# Patient Record
Sex: Female | Born: 1970 | Race: White | Hispanic: Yes | Marital: Single | Smoking: Never smoker
Health system: Southern US, Community
[De-identification: ages and names within clinical notes are randomized; demographics above are authoritative.]

## PROBLEM LIST (undated history)

## (undated) DIAGNOSIS — E2839 Other primary ovarian failure: Secondary | ICD-10-CM

## (undated) DIAGNOSIS — E288 Other ovarian dysfunction: Secondary | ICD-10-CM

## (undated) DIAGNOSIS — F419 Anxiety disorder, unspecified: Secondary | ICD-10-CM

## (undated) DIAGNOSIS — L811 Chloasma: Secondary | ICD-10-CM

## (undated) DIAGNOSIS — N83209 Unspecified ovarian cyst, unspecified side: Secondary | ICD-10-CM

## (undated) HISTORY — DX: Chloasma: L81.1

## (undated) HISTORY — DX: Unspecified ovarian cyst, unspecified side: N83.209

## (undated) HISTORY — DX: Anxiety disorder, unspecified: F41.9

## (undated) HISTORY — DX: Other ovarian dysfunction: E28.8

## (undated) HISTORY — PX: TUBAL LIGATION: SHX77

## (undated) HISTORY — DX: Other primary ovarian failure: E28.39

---

## 2003-04-11 ENCOUNTER — Encounter: Payer: Self-pay | Admitting: Obstetrics and Gynecology

## 2003-04-11 ENCOUNTER — Inpatient Hospital Stay (HOSPITAL_COMMUNITY): Admission: AD | Admit: 2003-04-11 | Discharge: 2003-04-11 | Payer: Self-pay | Admitting: Obstetrics and Gynecology

## 2003-09-25 ENCOUNTER — Inpatient Hospital Stay (HOSPITAL_COMMUNITY): Admission: AD | Admit: 2003-09-25 | Discharge: 2003-09-29 | Payer: Self-pay | Admitting: Family Medicine

## 2003-09-26 ENCOUNTER — Encounter (INDEPENDENT_AMBULATORY_CARE_PROVIDER_SITE_OTHER): Payer: Self-pay | Admitting: *Deleted

## 2003-10-12 ENCOUNTER — Inpatient Hospital Stay (HOSPITAL_COMMUNITY): Admission: AD | Admit: 2003-10-12 | Discharge: 2003-10-12 | Payer: Self-pay | Admitting: Obstetrics and Gynecology

## 2003-12-29 ENCOUNTER — Encounter: Admission: RE | Admit: 2003-12-29 | Discharge: 2003-12-29 | Payer: Self-pay | Admitting: Obstetrics and Gynecology

## 2004-01-24 ENCOUNTER — Encounter: Admission: RE | Admit: 2004-01-24 | Discharge: 2004-01-24 | Payer: Self-pay | Admitting: Obstetrics and Gynecology

## 2004-06-23 ENCOUNTER — Inpatient Hospital Stay (HOSPITAL_COMMUNITY): Admission: AD | Admit: 2004-06-23 | Discharge: 2004-06-23 | Payer: Self-pay | Admitting: *Deleted

## 2005-09-20 ENCOUNTER — Inpatient Hospital Stay (HOSPITAL_COMMUNITY): Admission: AD | Admit: 2005-09-20 | Discharge: 2005-09-20 | Payer: Self-pay | Admitting: Family Medicine

## 2005-10-23 ENCOUNTER — Inpatient Hospital Stay (HOSPITAL_COMMUNITY): Admission: AD | Admit: 2005-10-23 | Discharge: 2005-10-23 | Payer: Self-pay | Admitting: Obstetrics and Gynecology

## 2010-02-28 ENCOUNTER — Ambulatory Visit: Payer: Self-pay | Admitting: Gynecology

## 2010-03-06 ENCOUNTER — Ambulatory Visit: Payer: Self-pay | Admitting: Gynecology

## 2010-03-15 ENCOUNTER — Ambulatory Visit: Payer: Self-pay | Admitting: Gynecology

## 2010-03-27 ENCOUNTER — Encounter: Admission: RE | Admit: 2010-03-27 | Discharge: 2010-03-27 | Payer: Self-pay | Admitting: Gynecology

## 2010-05-10 ENCOUNTER — Ambulatory Visit: Payer: Self-pay | Admitting: Gynecology

## 2010-07-25 ENCOUNTER — Ambulatory Visit: Payer: Self-pay | Admitting: Gynecology

## 2010-12-01 ENCOUNTER — Encounter: Payer: Self-pay | Admitting: Family Medicine

## 2010-12-12 ENCOUNTER — Ambulatory Visit: Payer: BC Managed Care – PPO | Admitting: Gynecology

## 2010-12-12 DIAGNOSIS — N898 Other specified noninflammatory disorders of vagina: Secondary | ICD-10-CM

## 2010-12-12 DIAGNOSIS — B3731 Acute candidiasis of vulva and vagina: Secondary | ICD-10-CM

## 2010-12-12 DIAGNOSIS — N915 Oligomenorrhea, unspecified: Secondary | ICD-10-CM

## 2010-12-12 DIAGNOSIS — Z113 Encounter for screening for infections with a predominantly sexual mode of transmission: Secondary | ICD-10-CM

## 2010-12-12 DIAGNOSIS — B373 Candidiasis of vulva and vagina: Secondary | ICD-10-CM

## 2010-12-18 ENCOUNTER — Other Ambulatory Visit (HOSPITAL_COMMUNITY)
Admission: RE | Admit: 2010-12-18 | Discharge: 2010-12-18 | Disposition: A | Discharge status: Home / Self Care | Payer: BC Managed Care – PPO | Source: Ambulatory Visit | Attending: Gynecology | Admitting: Gynecology

## 2010-12-18 DIAGNOSIS — Z124 Encounter for screening for malignant neoplasm of cervix: Secondary | ICD-10-CM | POA: Insufficient documentation

## 2011-01-07 ENCOUNTER — Encounter: Payer: BC Managed Care – PPO | Admitting: Gynecology

## 2011-01-07 ENCOUNTER — Encounter (INDEPENDENT_AMBULATORY_CARE_PROVIDER_SITE_OTHER): Payer: BC Managed Care – PPO | Admitting: Gynecology

## 2011-01-07 ENCOUNTER — Other Ambulatory Visit: Payer: Self-pay | Admitting: Gynecology

## 2011-01-07 DIAGNOSIS — Z13220 Encounter for screening for lipoid disorders: Secondary | ICD-10-CM

## 2011-01-07 DIAGNOSIS — Z833 Family history of diabetes mellitus: Secondary | ICD-10-CM

## 2011-01-07 DIAGNOSIS — Z01419 Encounter for gynecological examination (general) (routine) without abnormal findings: Secondary | ICD-10-CM

## 2011-03-29 NOTE — Discharge Summary (Signed)
Kelsey Bowen, Kelsey Bowen                           ACCOUNT NO.:  [id number]   MEDICAL RECORD NO.:  [id number]                   PATIENT TYPE:  INP   LOCATION:  9131                                 FACILITY:  [facility]   PHYSICIAN:  Tanya S. Shawnie Pons, M.D.                DATE OF BIRTH:  08/18/1971   DATE OF ADMISSION:  09/25/2003  DATE OF DISCHARGE:  09/29/2003                                 DISCHARGE SUMMARY   DISCHARGE DIAGNOSES:  1. Repeat vertical cesarean section.  2. Late onset preeclampsia.   DISCHARGE MEDICATIONS:  1. Ibuprofen 600 mg p.o. q.6h. p.r.n.  2. Percocet 5/325 one to two tablets p.o. q.6h. p.r.n.  3. Prenatal vitamins once a day x6 weeks.   DISPOSITION:  The patient discharged home with baby.   FOLLOWUP:  The patient will follow up in six weeks at [facility].   BRIEF ADMISSION HISTORY:  This is a 40 year old G4, P2-2-0-3 who presented  at 3 and 2 weeks in early labor.  The patient was GBS positive.  Blood type  was O+.  Antibody negative.  Hemoglobin was initially 12.2 pre delivery.  Platelets were 259,000.  Rubella immune.  Hepatitis B surface antigen  negative.  Syphilis nonreactive.  HIV negative.  GC, Chlamydia negative.  On  admission patient's cervical examination was 3 cm dilated and ballottable.  Initially, fetus had a nonreassuring fetal strip, but became reactive with  acoustic stimulation and remained reactive.  The patient was admitted to L&D  and given penicillin for GBS status.   HOSPITAL COURSE:  Problem 1 - DELIVERY:  The patient progressed with her  cervix 5-6 cm, 100% effaced at a -1 station.  Her membranes were ruptured  with clear fluid prior to delivery.  The patient continued to progress with  cervix being 9 cm dilated, baby at 0 station.  The patient had elevated  blood pressures during labor up to 150/103.  Fetus had fetal bradycardia up  to approximately 5-10 minutes.  The patient was rushed to OR for stat  cesarean section.  The  patient had a repeat vertical cesarean section and it  was noted that there was a uterine window.  The patient was advised that for  the future if she were to try to get pregnant again that she should not  attempt a trial of labor.  Infant's Apgars were 2 at one minute, 7 at five  minutes, and 7 at 10 minutes.  Cord pH was 6.8.  The baby was resuscitated  and stayed in the NICU, but is being discharged with mom.   Problem 2 - PREECLAMPSIA:  The patient again began to have elevated blood  pressures during labor.  No elevated blood pressures were noted in her  prenatal care.  The patient was placed on magnesium sulfate postpartum and  was admitted to the AICU.  The patient obtained a magnesium level of  8.9 and  began to be symptomatic.  The magnesium was turned off.  Postoperative day  #1 after noting the elevated magnesium level the patient had some blurry  vision which resolved.  The patient diuresed very well.  Blood pressures  began to trend down.  Blood pressures within the 130s/80s at discharge.  The  patient completely asymptomatic with very good urine output.   Problem 3 - Postoperative day #3 patient had been discharged with baby.  She  is breast-feeding.  The patient wants a tubal ligation for contraception and  arrangements were made for patient to sign consent before discharge for  that.  She will follow up in [facility] Clinic in three weeks for a tubal  evaluation.   DISCHARGE LABORATORIES:  On admission patient had [facility] laboratories drawn and  initially patient had a uric acid of 7.2 with a BUN and creatinine of 7 and  1.0 and a lactate dehydrogenase of 250.  At discharge patient's lactate  dehydrogenase was 169.  Discharge uric acid was 6.3.  BUN and creatinine are  8 and 0.9 at discharge.     Anastasio Auerbach, MD                          Shelbie Proctor. Shawnie Pons, M.D.    AD/MEDQ  D:  09/29/2003  T:  09/30/2003  Job:  [id number]   cc:   [facility]

## 2011-03-29 NOTE — Op Note (Signed)
NAMECARMELINA, Kelsey Bowen                           ACCOUNT NO.:  [id number]   MEDICAL RECORD NO.:  [id number]                   PATIENT TYPE:  INP   LOCATION:  9174                                 [facility]ACILITY:  [facility]   PHYSICIAN:  Kelsey Bowen, M.D.                DATE OF BIRTH:  [birth year]   DATE OF PROCEDURE:  DATE OF DISCHARGE:                                 OPERATIVE REPORT   PREOPERATIVE DIAGNOSIS:  Fetal bradycardia.   POSTOPERATIVE DIAGNOSIS:  Fetal bradycardia.   PROCEDURE:  Repeat cesarean section, transverse incision.   SURGEON:  Kelsey Bowen. Shawnie Bowen, M.D.   ASSISTANT:  Kelsey Bowen, M.D.   ANESTHESIA:  MAC.   ANESTHESIOLOGIST:  Kelsey Bowen, M.D.   FINDINGS:  Viable female infant, Apgars 7 and 7.  Cord pH 6.8.   ESTIMATED BLOOD LOSS:  1000 mL.   COMPLICATIONS:  None.   SPECIMENS:  Placenta -- to pathology.   REASON FOR PROCEDURE:  The patient is a 40 year old gravida 4, para 1-2-0-2,  with two prior C-sections, who had a previous vaginal birth in 27.  She  was admitted in active labor.  Her labor has stalled at 8 cm and Pitocin was  started.  Her fetal heart tracing had been reassuring and she did become  complete and began pushing.  Approximately 15 minutes prior to delivery, the  infant developed a bradycardia.  Initial heart rate seemed to be in the 90s  to 100; this was about 5 minutes, followed by declining of the fetal heart  rate to the 70s to 80s.  By the time I was called and arrived at the room in  less than a minute, the patient was checked to feel the head was vertex and  at a 0 station.  The heart tracing was reviewed by myself and the decision  was made to take the patient to the operating room.  The patient was then  taken to the operating room and she was quickly prepped and draped in the  usual sterile fashion.  The anesthesia was tested using an Allis clamp and  her testing was found to be normal.  A knife was then used to make the  incision  through her old incision vertically.  This was carried down to the  fascia.  Once the fascia was incised, the patient began to feel some pain.  She was quickly given nitrous oxide and went to sleep.  Upon entry into the  abdominal cavity, there was noted to be a very thin layer of peritoneum and  the infant could be seen inside of this.  It appeared that the uterus had a  window in it.  The uterine cavity was entered with a hemostat; clear fluid  was noted about the baby.  The infant's head was wedged in the pelvis and it  was very difficult for deliver;  someone pushed up  the head from below until  it as deliverable; the head was then delivered atraumatically.  The baby was  bulb-suctioned, cord clamped x2 and taken to the awaiting pediatricians.  Vital statistics were as dictated previously.  A cord pH was obtained, as  well as a cord gas, then the placenta was delivered manually.  The edges of  the incision were grasped with Allis' or ring forceps and the uterine  incision closed with a locked running #1 Vicryl suture, superior to  inferior.  The inferior edge of the incision was very low on the cervix and  it looked as though the incision had been extended even further down on the  patient's right side.  A second imbricating layer was then used over the  first to continue to close the uterus and down the right side of the cervix.  When this was done, there was noted to be some bleeding in the right corner  and a figure-of-eight was then used to reinforce the right edge of the  cervical incision.  Hemostasis was noted at that time.  A lap pad was placed  in the incision for pressure and added hemostasis and this was removed and  incision looked dry.  Attention was then turned to the fascia, which was  closed with a #1 PDS looped suture.  Full-thickness mass closure was done  until the last 2 cm of the incision, in which fascia only was closed.  The  subcutaneous tissue was copiously  irrigated and any bleeders were cauterized  with the electrocautery and the skin closed using clips.  Due to the  emergent nature of the C-section, a lap and instrument count was not  performed prior to the procedure and an x-ray was taken postoperatively.  The patient was then taken to recovery.                                               Kelsey Bowen. Shawnie Bowen, M.D.    TSP/MEDQ  D:  09/26/2003  T:  09/26/2003  Job:  [id number]

## 2011-03-29 NOTE — Op Note (Signed)
   NAMELILEY, RAKE                           ACCOUNT NO.:  [id number]   MEDICAL RECORD NO.:  [id number]                   PATIENT TYPE:  INP   LOCATION:  9174                                 [facility]ACILITY[facility]   PHYSICIAN:  Tanya S. Shawnie Pons, M.D.                DATE OF BIRTH:  April 22, 1971   DATE OF PROCEDURE:  DATE OF DISCHARGE:                                 OPERATIVE REPORT   ADDENDUM:  She also has a large uterine window noted and a uterine scar.   POSTOPERATIVE DIAGNOSIS:  Occult cord prolapse.  The cord was actually found  down at the baby's head during delivery.                                               Shelbie Proctor. Shawnie Pons, M.D.    TSP/MEDQ  D:  09/26/2003  T:  09/26/2003  Job:  [id number]

## 2011-03-29 NOTE — Group Therapy Note (Signed)
[name], Kelsey Bowen NO.:  [id number]   MEDICAL RECORD NO.:  [id number]                   PATIENT TYPE:  OUT   LOCATION:  [facility]                           FACILITY:  [facility]   PHYSICIAN:  Argentina Donovan, MD                     DATE OF BIRTH:  1971-07-13   DATE OF SERVICE:  12/29/2003                                    CLINIC NOTE   REASON FOR VISIT:  The patient is a 40 year old Hispanic female who  underwent C-section in November 2004.  Since then she has had pain in her  incision and when the patient stands up you see a 1 cm bulging area that is  firm and nodular at the lower right pole of the incision, probably an  unabsorbed suture.  On the other hand, this certainly could be just a  thickening of the scar.  We had injected it with Kenalog 10 1 mL and we are  going to have the patient return in 3 weeks.  We have given her the  medication to take home and put in the refrigerator and bring back when she  does come.  We will see if that takes care of it.  If it does not, we will  try one more injection and if that fails we will excise the small area.   IMPRESSION:  Painful nodule in cesarean section incision 3 months post  cesarean section.                                               Argentina Donovan, MD    PR/MEDQ  D:  12/29/2003  T:  12/29/2003  Job:  [id number]

## 2011-06-28 ENCOUNTER — Ambulatory Visit: Payer: BC Managed Care – PPO | Admitting: Women's Health

## 2011-07-01 ENCOUNTER — Other Ambulatory Visit: Payer: Self-pay

## 2011-07-01 ENCOUNTER — Encounter: Payer: Self-pay | Admitting: *Deleted

## 2011-07-01 ENCOUNTER — Emergency Department (HOSPITAL_BASED_OUTPATIENT_CLINIC_OR_DEPARTMENT_OTHER)
Admission: EM | Admit: 2011-07-01 | Discharge: 2011-07-01 | Disposition: A | Discharge status: Home / Self Care | Payer: Self-pay | Attending: Emergency Medicine | Admitting: Emergency Medicine

## 2011-07-01 DIAGNOSIS — R5381 Other malaise: Secondary | ICD-10-CM | POA: Insufficient documentation

## 2011-07-01 DIAGNOSIS — N12 Tubulo-interstitial nephritis, not specified as acute or chronic: Secondary | ICD-10-CM | POA: Insufficient documentation

## 2011-07-01 DIAGNOSIS — R55 Syncope and collapse: Secondary | ICD-10-CM | POA: Insufficient documentation

## 2011-07-01 DIAGNOSIS — R110 Nausea: Secondary | ICD-10-CM | POA: Insufficient documentation

## 2011-07-01 LAB — URINE MICROSCOPIC-ADD ON

## 2011-07-01 LAB — BASIC METABOLIC PANEL
CO2: 23 mEq/L (ref 19–32)
Calcium: 8 mg/dL — ABNORMAL LOW (ref 8.4–10.5)
Chloride: 110 mEq/L (ref 96–112)
Creatinine, Ser: 0.5 mg/dL (ref 0.50–1.10)
Glucose, Bld: 101 mg/dL — ABNORMAL HIGH (ref 70–99)
Sodium: 140 mEq/L (ref 135–145)

## 2011-07-01 LAB — URINALYSIS, ROUTINE W REFLEX MICROSCOPIC
Glucose, UA: NEGATIVE mg/dL
pH: 6 (ref 5.0–8.0)

## 2011-07-01 LAB — CBC
Hemoglobin: 11.4 g/dL — ABNORMAL LOW (ref 12.0–15.0)
MCH: 30.4 pg (ref 26.0–34.0)
MCV: 89.9 fL (ref 78.0–100.0)
Platelets: 272 10*3/uL (ref 150–400)
RBC: 3.75 MIL/uL — ABNORMAL LOW (ref 3.87–5.11)
WBC: 10.3 10*3/uL (ref 4.0–10.5)

## 2011-07-01 LAB — PREGNANCY, URINE: Preg Test, Ur: NEGATIVE

## 2011-07-01 MED ORDER — CIPROFLOXACIN HCL 500 MG PO TABS: 500.0000 mg | ORAL_TABLET | Freq: Two times a day (BID) | ORAL | Status: AC

## 2011-07-01 MED ORDER — SODIUM CHLORIDE 0.9 % IV BOLUS (SEPSIS)
1000.0000 mL | Freq: Once | INTRAVENOUS | Status: AC
  Administered 2011-07-01: 1000 mL via INTRAVENOUS

## 2011-07-01 MED ORDER — ONDANSETRON HCL 4 MG PO TABS: 8.0000 mg | ORAL_TABLET | Freq: Three times a day (TID) | ORAL | Status: AC | PRN

## 2011-07-01 MED ORDER — DEXTROSE 5 % IV SOLN
1.0000 g | INTRAVENOUS | Status: DC
  Administered 2011-07-01: 1 g via INTRAVENOUS
  Filled 2011-07-01: qty 1

## 2011-07-01 NOTE — ED Notes (Signed)
Unsuccessful attempt x 3 to draw blood.

## 2011-07-01 NOTE — ED Provider Notes (Signed)
History     CSN: [id number] Arrival date & time: 07/01/2011  8:25 AM  Chief Complaint  Patient presents with  . Near Syncope   The history is provided by the patient.   patient reports the onset of generalized weakness x6-7 days.  She is asked frequency of urination and dysuria for several days.  She developed some mild right flank pain.  She denied fever or chills.  Today she reported feeling lightheaded and dizzy.  She reports near syncope without syncope.  Symptoms have been constant and worsening.  She denies diarrhea.  Today she developed nausea without vomiting.  Denies rash.  Denies abdominal pain,  chest pain, shortness of breath or cough.  Denies headache or neck pain.  She reports no recent travel or sick contacts.  Orders reported that she was very pale and EMS was called.  Therefore the EMS that she had some crampy stomach pain like she was going to have diarrhea for the stick blood sugar was in the EMS and she received a 500 cc normal saline bolus prior to arrival.  Patient reports she feels better at this time.  The symptoms are not worsened by anything nor they improved by anything reports a history of low blood pressure in the past.  A blood pressure on arrival to the emergency department was 82/61.  The patient has been taking pyridium for 2 days.  History reviewed. No pertinent past medical history.  Past Surgical History  Procedure Date  . Cesarean section   . Tubal ligation     No family history on file.  History  Substance Use Topics  . Smoking status: Never Smoker   . Smokeless tobacco: Not on file  . Alcohol Use: No    OB History    Grav Para Term Preterm Abortions TAB SAB Ect Mult Living   4 4              Review of Systems  Musculoskeletal: Positive for extremity weakness.  All other systems reviewed and are negative.    Physical Exam  BP 82/61  Pulse 108  Temp(Src) 97.1 F (36.2 C) (Oral)  Resp 20  Ht 5' (1.524 m)  Wt 116 lb (52.617 kg)  BMI  22.65 kg/m2  SpO2 100%  LMP 06/23/2011  Physical Exam  Nursing note and vitals reviewed. Constitutional: She is oriented to person, place, and time. She appears well-developed and well-nourished. No distress.       Blood pressure 82 over 61  HENT:  Head: Normocephalic and atraumatic.  Mouth/Throat: Oropharynx is clear and moist.  Eyes: Conjunctivae and EOM are normal.  Neck: Normal range of motion. No JVD present. No thyromegaly present.  Cardiovascular: Normal rate, regular rhythm and normal heart sounds.   Pulmonary/Chest: Effort normal and breath sounds normal.  Abdominal: Soft. She exhibits no distension. There is no tenderness.  Musculoskeletal: Normal range of motion.  Neurological: She is alert and oriented to person, place, and time.  Skin: Skin is warm and dry.  Psychiatric: She has a normal mood and affect. Judgment normal.    ED Course  Procedures  MDM Patient given a fluid bolus on arrival to the emergency department.  She is well appearing.  I suspect her blood pressure normally ranges in the systolic of 90 range.  She denies black or bloody stools.  I suspect she has a urinary tract infection and early pyelonephritis.  Will treat symptoms at this time obtain laboratory and urine studies.  Will  evaluate closely  Results for orders placed during the hospital encounter of 07/01/11  CBC      Component Value Range   WBC 10.3  4.0 - 10.5 (K/uL)   RBC 3.75 (*) 3.87 - 5.11 (MIL/uL)   Hemoglobin 11.4 (*) 12.0 - 15.0 (g/dL)   HCT 04.5 (*) 40.9 - 46.0 (%)   MCV 89.9  78.0 - 100.0 (fL)   MCH 30.4  26.0 - 34.0 (pg)   MCHC 33.8  30.0 - 36.0 (g/dL)   RDW 81.1  91.4 - 78.2 (%)   Platelets 272  150 - 400 (K/uL)  BASIC METABOLIC PANEL      Component Value Range   Sodium 140  135 - 145 (mEq/L)   Potassium 4.4  3.5 - 5.1 (mEq/L)   Chloride 110  96 - 112 (mEq/L)   CO2 23  19 - 32 (mEq/L)   Glucose, Bld 101 (*) 70 - 99 (mg/dL)   BUN 9  6 - 23 (mg/dL)   Creatinine, Ser 9.56   0.50 - 1.10 (mg/dL)   Calcium 8.0 (*) 8.4 - 10.5 (mg/dL)   GFR calc non Af Amer >60  >60 (mL/min)   GFR calc Af Amer >60  >60 (mL/min)  URINALYSIS, ROUTINE W REFLEX MICROSCOPIC      Component Value Range   Color, Urine ORANGE (*) YELLOW    Appearance CLOUDY (*) CLEAR    Specific Gravity, Urine 1.018  1.005 - 1.030    pH 6.0  5.0 - 8.0    Glucose, UA NEGATIVE  NEGATIVE (mg/dL)   Hgb urine dipstick SMALL (*) NEGATIVE    Bilirubin Urine SMALL (*) NEGATIVE    Ketones, ur 15 (*) NEGATIVE (mg/dL)   Protein, ur 30 (*) NEGATIVE (mg/dL)   Urobilinogen, UA 1.0  0.0 - 1.0 (mg/dL)   Nitrite POSITIVE (*) NEGATIVE    Leukocytes, UA LARGE (*) NEGATIVE   PREGNANCY, URINE      Component Value Range   Preg Test, Ur NEGATIVE    URINE MICROSCOPIC-ADD ON      Component Value Range   Squamous Epithelial / LPF RARE  RARE    WBC, UA TOO NUMEROUS TO COUNT  <3 (WBC/hpf)   RBC / HPF 3-6  <3 (RBC/hpf)   Bacteria, UA MANY (*) RARE    No results found.  9:58 AM The patient feels much better at this time.  Her heart rate and blood pressure have improved.  Ceftriaxone has been given for her urinary tract infection and a urine culture has been sent.  I suspect she was mildly volume depleted as the cause of her symptoms earlier today.  She reports she feels much better.  Discharge home for close primary care followup or return to the ER for worsening symptoms  Lyanne Co, MD 07/01/11 [phone]

## 2011-07-01 NOTE — ED Notes (Signed)
EMS reports that patient was at work and her coworkers noticed that she was very pale.  EMS called.  Patient states she did not feel good 3 days ago, felt weak and tired.  This am she states she felt weak, nauseated with generalized stomach crampy stomach pain like she was going to have diarrhea.  Denies vomiting or diarrhea.  IV was started, 500 cc ns bolus given, CBG 90.

## 2011-07-03 LAB — URINE CULTURE

## 2011-07-04 NOTE — ED Notes (Signed)
Results received from [facility].  URNC, >/= 100,000 colonies -> E Coli.  Rx given in ED for Cipro -> sensitive to the same.  Chart appended per protocol.

## 2011-08-09 ENCOUNTER — Other Ambulatory Visit: Payer: Self-pay | Admitting: Gynecology

## 2011-08-09 DIAGNOSIS — Z1231 Encounter for screening mammogram for malignant neoplasm of breast: Secondary | ICD-10-CM

## 2011-08-29 ENCOUNTER — Ambulatory Visit: Payer: Self-pay

## 2011-10-07 ENCOUNTER — Ambulatory Visit (INDEPENDENT_AMBULATORY_CARE_PROVIDER_SITE_OTHER): Payer: BC Managed Care – PPO | Admitting: Gynecology

## 2011-10-07 ENCOUNTER — Encounter: Payer: Self-pay | Admitting: Gynecology

## 2011-10-07 DIAGNOSIS — A499 Bacterial infection, unspecified: Secondary | ICD-10-CM

## 2011-10-07 DIAGNOSIS — N760 Acute vaginitis: Secondary | ICD-10-CM

## 2011-10-07 DIAGNOSIS — B373 Candidiasis of vulva and vagina: Secondary | ICD-10-CM

## 2011-10-07 DIAGNOSIS — B9689 Other specified bacterial agents as the cause of diseases classified elsewhere: Secondary | ICD-10-CM

## 2011-10-07 DIAGNOSIS — Z113 Encounter for screening for infections with a predominantly sexual mode of transmission: Secondary | ICD-10-CM

## 2011-10-07 DIAGNOSIS — N898 Other specified noninflammatory disorders of vagina: Secondary | ICD-10-CM

## 2011-10-07 DIAGNOSIS — B3731 Acute candidiasis of vulva and vagina: Secondary | ICD-10-CM

## 2011-10-07 LAB — HEPATITIS B SURFACE ANTIGEN: Hepatitis B Surface Ag: NEGATIVE

## 2011-10-07 LAB — HEPATITIS C ANTIBODY: HCV Ab: NEGATIVE

## 2011-10-07 LAB — HIV ANTIBODY (ROUTINE TESTING W REFLEX): HIV: NONREACTIVE

## 2011-10-07 MED ORDER — TERCONAZOLE 0.8 % VA CREA: 1.0000 | TOPICAL_CREAM | Freq: Every day | VAGINAL | Status: AC

## 2011-10-07 MED ORDER — METRONIDAZOLE 500 MG PO TABS: 500.0000 mg | ORAL_TABLET | Freq: Two times a day (BID) | ORAL | Status: AC

## 2011-10-07 NOTE — Progress Notes (Signed)
Called in rx's escribe down.

## 2011-10-07 NOTE — Progress Notes (Signed)
Patient presented to the office today with a complaint of vaginal discharge with some pruritus and some odor. Patient with prior tubal sterilization procedure. Patient also wanted to have an STD screen as well. She's having normal menstrual cycles otherwise.  Pelvic exam: Bartholin urethra Skene glands: Within normal limits Vagina: White fishy discharge noted Cervix: No lesions or discharge  GC and Chlamydia culture was obtained as well as a wet prep.  Wet prep: Demonstrated bacterial vaginosis and moniliasis  Plan: Flagyl 500 mg one by mouth twice a day for 5 days along with Terazol 7 to apply each bedtime for one week. Patient to stop in the lab to have an HIV, RPR, hepatitis B, hepatitis C as part of her STD workup. Will notify her in the next 48 hours if there is any abnormality of any the above mentioned test otherwise she scheduled to return back in February for her annual exam or when necessary.

## 2011-10-28 ENCOUNTER — Ambulatory Visit: Payer: BC Managed Care – PPO | Admitting: Gynecology

## 2011-10-29 ENCOUNTER — Ambulatory Visit: Payer: BC Managed Care – PPO | Admitting: Gynecology

## 2011-11-01 ENCOUNTER — Ambulatory Visit (INDEPENDENT_AMBULATORY_CARE_PROVIDER_SITE_OTHER): Payer: BC Managed Care – PPO | Admitting: Gynecology

## 2011-11-01 ENCOUNTER — Encounter: Payer: Self-pay | Admitting: Gynecology

## 2011-11-01 VITALS — BP 128/80 | Wt 114.0 lb

## 2011-11-01 DIAGNOSIS — R5381 Other malaise: Secondary | ICD-10-CM

## 2011-11-01 DIAGNOSIS — R630 Anorexia: Secondary | ICD-10-CM

## 2011-11-01 DIAGNOSIS — R634 Abnormal weight loss: Secondary | ICD-10-CM

## 2011-11-01 DIAGNOSIS — R5383 Other fatigue: Secondary | ICD-10-CM

## 2011-11-01 NOTE — Patient Instructions (Signed)
La llamaremos en una semana si los resultados estan anormal. Kelsey Bowen que haga cita con Dr. Willow [facility].

## 2011-11-01 NOTE — Progress Notes (Signed)
Patient is a 40 year old gravida 5 para 4 Ab1 who presented to the office today complaining of dryness fatigue feeling weak upset stomach in the morning states she's lost weight lack of energy morning headaches and decrease appetite. She was seen in the office 10/08/2011 wanted to have an STD screen and HIV, RPR, hepatitis B and C. were all normal. Patient having normal menstrual cycle has had a previous tubal sterilization procedure. Had a mammogram in May of last year which was normal.  Exam: Blood pressure 128/80 HEENT: Unremarkable Neck: Supple trachea midline no carotid bruits no thyromegaly Lungs: Clear to auscultation Rogers or wheezes Heart: Regular rate and rhythm no murmurs or gallops Extremities: No motor sensory deficits no edema  Assessment/plan: We'll do a basic lab work to include comprehensive metabolic panel, TSH, CBC, urinalysis, and vitamin D. I'm going to refer her to one of my internal medicine colleagues for followup and further evaluation.

## 2011-11-06 ENCOUNTER — Ambulatory Visit: Payer: BC Managed Care – PPO | Admitting: Gynecology

## 2011-11-07 NOTE — Progress Notes (Signed)
Addended byCammie Mcgee T on: 11/07/2011 11:04 AM   Modules accepted: Orders

## 2012-03-10 ENCOUNTER — Ambulatory Visit (INDEPENDENT_AMBULATORY_CARE_PROVIDER_SITE_OTHER): Payer: BC Managed Care – PPO | Admitting: Gynecology

## 2012-03-10 ENCOUNTER — Encounter: Payer: Self-pay | Admitting: Gynecology

## 2012-03-10 VITALS — BP 110/68 | Ht 59.25 in | Wt 115.0 lb

## 2012-03-10 DIAGNOSIS — N898 Other specified noninflammatory disorders of vagina: Secondary | ICD-10-CM

## 2012-03-10 DIAGNOSIS — E162 Hypoglycemia, unspecified: Secondary | ICD-10-CM

## 2012-03-10 DIAGNOSIS — Z01419 Encounter for gynecological examination (general) (routine) without abnormal findings: Secondary | ICD-10-CM

## 2012-03-10 DIAGNOSIS — L819 Disorder of pigmentation, unspecified: Secondary | ICD-10-CM

## 2012-03-10 DIAGNOSIS — E288 Other ovarian dysfunction: Secondary | ICD-10-CM

## 2012-03-10 DIAGNOSIS — N912 Amenorrhea, unspecified: Secondary | ICD-10-CM

## 2012-03-10 DIAGNOSIS — N942 Vaginismus: Secondary | ICD-10-CM

## 2012-03-10 DIAGNOSIS — R61 Generalized hyperhidrosis: Secondary | ICD-10-CM

## 2012-03-10 DIAGNOSIS — R140 Abdominal distension (gaseous): Secondary | ICD-10-CM

## 2012-03-10 DIAGNOSIS — B9689 Other specified bacterial agents as the cause of diseases classified elsewhere: Secondary | ICD-10-CM

## 2012-03-10 DIAGNOSIS — L811 Chloasma: Secondary | ICD-10-CM

## 2012-03-10 DIAGNOSIS — E2839 Other primary ovarian failure: Secondary | ICD-10-CM

## 2012-03-10 LAB — URINALYSIS W MICROSCOPIC + REFLEX CULTURE
Bilirubin Urine: NEGATIVE
Casts: NONE SEEN
Crystals: NONE SEEN
Glucose, UA: NEGATIVE mg/dL
Ketones, ur: NEGATIVE mg/dL
Leukocytes, UA: NEGATIVE
Nitrite: NEGATIVE
Protein, ur: NEGATIVE mg/dL
Specific Gravity, Urine: 1.02 (ref 1.005–1.030)
Urobilinogen, UA: 0.2 mg/dL (ref 0.0–1.0)
WBC, UA: NONE SEEN WBC/hpf (ref <3)
pH: 5 (ref 5.0–8.0)

## 2012-03-10 LAB — CBC WITH DIFFERENTIAL/PLATELET
Basophils Absolute: 0 10*3/uL (ref 0.0–0.1)
Basophils Relative: 1 % (ref 0–1)
Eosinophils Absolute: 0.1 10*3/uL (ref 0.0–0.7)
Eosinophils Relative: 2 % (ref 0–5)
HCT: 37.6 % (ref 36.0–46.0)
Hemoglobin: 12.4 g/dL (ref 12.0–15.0)
Lymphocytes Relative: 37 % (ref 12–46)
Lymphs Abs: 2 10*3/uL (ref 0.7–4.0)
MCH: 30 pg (ref 26.0–34.0)
MCHC: 33 g/dL (ref 30.0–36.0)
MCV: 90.8 fL (ref 78.0–100.0)
Monocytes Absolute: 0.4 10*3/uL (ref 0.1–1.0)
Monocytes Relative: 7 % (ref 3–12)
Neutro Abs: 2.9 10*3/uL (ref 1.7–7.7)
Neutrophils Relative %: 54 % (ref 43–77)
Platelets: 309 10*3/uL (ref 150–400)
RBC: 4.14 MIL/uL (ref 3.87–5.11)
RDW: 13.8 % (ref 11.5–15.5)
WBC: 5.4 10*3/uL (ref 4.0–10.5)

## 2012-03-10 LAB — COMPREHENSIVE METABOLIC PANEL
ALT: 13 U/L (ref 0–35)
AST: 16 U/L (ref 0–37)
Albumin: 4.4 g/dL (ref 3.5–5.2)
Alkaline Phosphatase: 86 U/L (ref 39–117)
BUN: 11 mg/dL (ref 6–23)
CO2: 29 mEq/L (ref 19–32)
Calcium: 9.6 mg/dL (ref 8.4–10.5)
Chloride: 106 mEq/L (ref 96–112)
Creat: 0.57 mg/dL (ref 0.50–1.10)
Glucose, Bld: 65 mg/dL — ABNORMAL LOW (ref 70–99)
Potassium: 4.3 mEq/L (ref 3.5–5.3)
Sodium: 141 mEq/L (ref 135–145)
Total Bilirubin: 0.4 mg/dL (ref 0.3–1.2)
Total Protein: 6.5 g/dL (ref 6.0–8.3)

## 2012-03-10 LAB — PROLACTIN: Prolactin: 8.6 ng/mL

## 2012-03-10 LAB — FOLLICLE STIMULATING HORMONE: FSH: 63.4 m[IU]/mL

## 2012-03-10 LAB — TSH: TSH: 1.334 u[IU]/mL (ref 0.350–4.500)

## 2012-03-10 LAB — PREGNANCY, URINE: Preg Test, Ur: NEGATIVE

## 2012-03-10 MED ORDER — METRONIDAZOLE 500 MG PO TABS: 500.0000 mg | ORAL_TABLET | Freq: Two times a day (BID) | ORAL | Status: AC

## 2012-03-10 NOTE — Patient Instructions (Addendum)
Dermatologa: Dra. Amy McMichael                         [facility]                         Dept. Of Dermatology                         [phone]  Melasma (Melasma)  El melasma es una enfermedad de la piel. No se transmite de [place] persona a otra (no es contagioso). Es una zona de tono bronceado o marrn que generalmente aparece en las [place], frente, parte superior del labio y cuello. Estas manchas pueden semejarse a [place]. La zona de diferente color no pica y no est roja ni hinchada. Generalmente ocurre en mujeres con una piel que se colorea (pigmenta) fcilmente y tambin en mujeres de color de piel marrn claro. Ocurre con frecuencia durante el embarazo, en mujeres menopusicas que reciben reemplazo hormonal, en enfermedades del hgado, o cuando se toman anticonceptivos orales y se exponen al sol. Tambin pueden sufrirlo los hombres y mujeres no embarazadas. Es ms frecuente en climas tropicales.  CAUSAS  Incremento de las clulas que producen pigmentos (melanocitos) en la piel.   Embarazo.   Uso de anticonceptivos orales.   Mujeres menopusicas que siguen un tratamiento de reemplazo hormonal.   Aumento de la exposicin al sol en mujeres de piel marrn claro.   Puede ser hereditario.   Nash Mantis a ciertos medicamentos o cosmticos.   Enfermedad tiroidea.   Enfermedad de [place] (prdida de la funcin de la glndula adrenal).   Estrs excesivo.  SNTOMAS No hay otros sntomas ms que el color diferente de la piel, en manchas oscuras o de color tostado. DIAGNSTICO  El melasma se diagnostica basndose en la apariencia fsica.   Con la lmpara de [place] para [employer].  PREVENCIN Para disminuir el riesgo de sufrir este problema, deben evitarse los anticonceptivos orales, los tratamientos de reemplazo hormonal en la menopausia y la exposicin al sol.  PRONSTICO No tiene efectos a Air cabin crew. Puede ser de ayuda el uso de pantalla  solar.  TRATAMIENTO  Cremas blanqueadoras, productos para el cuidado de la piel, peelings con cido gliclico y Civil engineer, contracting que bloquee los rayos ultravioletas son de ayuda para el tratamiento de esta afeccin.   La piel oscurecida generalmente mejora luego del parto o cuando se suspende el uso de los anticonceptivos [place].   Los productos especficos para tratar este problema pueden tener efectos secundarios. Algunas personas tienen una reaccin alrgica leve a las cremas o blanqueadores.   Utilizar una combinacin de hidroquinona y cido gliclico, medicamentos de venta libre o prescriptos. Siga las indicaciones de su mdico.   Tretinona Debe evitarse durante el [place].   cido azelaico al 20%   Peeling facial con cido alfa hidroxido o peelings qumicos.   Tratamiento con rayos lser.   Puede usar Airline pilot.  Evite la exposicin al sol durante cualquiera de estos tratamientos. INSTRUCCIONES PARA EL CUIDADO DOMICILIARIO  Si los problemas empeoran debe consultar con su mdico.   Evite la exposicin excesiva al sol, especialmente en las zonas tropicales.   Use una pantalla solar fuerte cuando se encuentre al sol.  SOLICITE ANTENCIN MDICA SI:  Le aparecen manchas en la piel y quiere controlarlas.   Quiere saber qu tipo de tratamiento puede seguir para  solucionar el problema.   Tiene manchas en la piel, y estas empeoran con o sin tratamiento.   Observa que las manchas de la piel sangran.  Document Released: 04/15/2008 Document Revised: 10/17/2011 [facility] Patient Information 2012 [place], [place].  Vaginosis bacteriana (Bacterial Vaginosis)   La vaginosis bacteriana es una infeccin vaginal en la que el equilibrio normal de las bacterias de la vagina se modifica. Este equilibrio normal se ve afectado por un desarrollo excesivo de ciertas bacterias. Hay diferentes tipos de bacteria que causan la vaginosis bacteriana. Es el problema vaginal ms comn en las  mujeres de edad frtil. CAUSAS  La causa de este trastorno no se conoce bien. Se produce como consecuencia de un aumento o desequilibrio de las bacterias nocivas.   Algunas actividades o conductas pueden poner en peligro el equilibrio normal de las bacterias en la vagina, y Astronomer. Entre ellas:   Tener un compaero sexual o mltiples compaeros sexuales.   Las duchas vaginales   Usar un dispositivo intrauterino (DIU) como mtodo anticonceptivo.   No se conoce el papel que juega la actividad sexual en el desarrollo de [place] VB. Sin embargo, las mujeres que nunca tuvieron relaciones sexuales raramente se infectan.  El contagio no se produce en asientos de baos, camas, piscinas o por tocar objetos.  SNTOMAS  Flujo vaginal grisceo.   Olor parecido al pescado con la secrecin, en especial despus de Management consultant.   Picazn o irritacin de la vagina y la vulva.   Ardor o dolor al [employer].   Algunas mujeres no presentan ningn sntoma.  DIAGNSTICO El mdico realizar un examen vaginal para diagnosticar una vaginosis bacteriana. El mdico le indicar anlisis de laboratorio y observar las muestras del lquido vaginal en el microscopio. Buscar bacterias y clulas anormales (clulas clave), pH mayor a 4.5 y [place] prueba de aminas positivo, todos ellos asociados al BV.  RIESGOS Y COMPLICACIONES  Enfermedad plvica inflamatoria (EPI).   Infecciones luego de una ciruga ginecolgica.   VIH.   Virus del Herpes  TRATAMIENTO En algunos casos, la infeccin desaparece sin tratamiento. Sin embargo, todas las mujeres con sntomas de VB deben tratarse para evitar complicaciones, especialmente si se ha planificado una ciruga ginecolgica. Los compaeros varones generalmente no necesitan tratamiento. Sin embargo, puede contagiarse entre parejas femeninas, de modo que el tratamiento se realiza para Dietitian.   La VB puede tratarse con medicamentos que destruyen  grmenes (antibiticos). Estos se presentan en pldoras o en cremas vaginales. Tanto mujeres embarazadas como no embarazadas pueden usar ambos, pero se indican en dosis diferentes. Estos antibiticos no daan al beb.   La VB puede recurrir [employer]. Si esto ocurre, se prescribir un segundo tratamiento con antibiticos.   El tratamiento es importante en el caso de las mujeres [place]. Si no se trata, la VB puede causar [employer], especialmente en [employer] que ha tenido un parto prematuro en el pasado. Todas las mujeres embarazadas que tienen sntomas de VB deben ser controladas y tratadas.   En los casos de recurrencia crnica, se prescribe un tratamiento con un gel vaginal dos veces por semana  INSTRUCCIONES PARA EL CUIDADO DOMICILIARIO  Tome los medicamentos que le indic el mdico.   No mantenga relaciones sexuales Librarian, academic.   Comunique a sus compaeros sexuales que sufre una infeccin vaginal. Ellos deben concurrir para un control mdico si tienen problemas como una urticaria leve o picazn.   Practique el sexo seguro. Use preservativos. Tenga un  solo compaero sexual.  PREVENCIN Algunos pasos bsicos de prevencin pueden ayudar a reducir el riesgo de desequilibrio de las bacterias vaginales y de sufrir VB.  No mantener relaciones sexuales (abstinencia)   No utilice duchas vaginales.   Utilice todos los [employer] han prescripto para el [place], aunque los sntomas hayan desaparecido.   Comunique a su compaero sexual que sufre una VB. De ese modo podr tratase, si es necesario, y podr Economist.  SOLICITE ATENCIN MDICA SI:  Los sntomas no mejoran luego de [address]  [address] de [place].   Aumentan la secrecin, el dolor o la fiebre.  ASEGRESE QUE:   Comprende estas instrucciones.   Controlar su enfermedad.   Solicitar ayuda de inmediato si no mejora o empeora.  PARA MS INFORMACIN: Division de STD  Prevention (DSTDP), Centers for Disease Control and Prevention (Centros para el control y la prevencin de enfermedades, CDC): SolutionApps.co.za American Social Health Association (ASHA): www.ashastd.org  Document Released: 02/04/2008 Document Revised: 10/17/2011 [facility] Patient Information 2012 [place], [place].

## 2012-03-10 NOTE — Progress Notes (Signed)
Kelsey Bowen July 11, 1971 [id number]   History:    41 y.o.  for annual exam with multiple complaints today. Patient has a history of premature ovarian failure and had been on oral contraceptive pills and discontinued it herself 3 months ago. She stated that after she stopped the oral contraceptive pill she had 2 spontaneous menses but her last one was over a month ago now. She denies any visual disturbances, headaches, or double vision. Patient has prior tubal sterilization procedure done at the time of last cesarean section. Urine pregnancy test in the office today was negative. Patient in the past had extensive evaluation for premature ovarian failure back in 2011 whereby her FSH was found to be elevated at 76 and her LDH elevated at 45.3. That same year her TSH and prolactin were normal as was her ANA, 21-hydroxylase, rheumatoid factor as well as a.m. cortisol. Patient today was also complaining of vaginal discharge with odor. She is in a monogamous relationship. Her last mammogram was in 2011. She frequently does her self breast examination. She also been complaining of abdominal bloating regardless of meals. She also has a long-term history of dealing with melasma (face).  Past medical history,surgical history, family history and social history were all reviewed and documented in the EPIC chart.  Gynecologic History Patient's last menstrual period was 01/14/2012. Contraception: tubal ligation Last Pap: 2012. Results were: normal Last mammogram: 2011. Results were: normal  Obstetric History OB History    Grav Para Term Preterm Abortions TAB SAB Ect Mult Living   4 4 3 1      4      # Outc Date GA Lbr Len/2nd Wgt Sex Del Anes PTL Lv   1 PRE     F CS  Yes Yes   2 TRM     F SVD  No Yes   3 TRM     M CS  No Yes   4 TRM     F CS  No Yes       ROS:  Was performed and pertinent positives and negatives are included in the history.  Exam: chaperone present  BP 110/68  Ht 4' 11.25" (1.505 m)   Wt 115 lb (52.164 kg)  BMI 23.03 kg/m2  LMP 01/14/2012  Body mass index is 23.03 kg/(m^2).  General appearance : Well developed well nourished female. No acute distress HEENT: Neck supple, trachea midline, no carotid bruits, no thyroidmegaly Lungs: Clear to auscultation, no rhonchi or wheezes, or rib retractions  Heart: Regular rate and rhythm, no murmurs or gallops Breast:Examined in sitting and supine position were symmetrical in appearance, no palpable masses or tenderness,  no skin retraction, no nipple inversion, no nipple discharge, no skin discoloration, no axillary or supraclavicular lymphadenopathy Abdomen: no palpable masses or tenderness, no rebound or guarding Extremities: no edema or skin discoloration or tenderness  Pelvic:  Bartholin, Urethra, Skene Glands: Within normal limits             Vagina: No gross lesions or discharge  Cervix: No gross lesions or discharge  Uterus  anteverted, limited due to her vaginismus  Adnexa limited due to her vaginismus Anus and perineum  normal   Rectovaginal  normal sphincter tone without palpated masses or tenderness             Hemoccult not done     Assessment/Plan:  40 y.o. female for annual exam with prior history of premature ovarian failure who had 2 spontaneous menstrual cycles after discontinuing  oral contraceptive pills. She is now over a month since her last menstrual period. We'll check her FSH if it is back into normal range she will then be prescribed Provera 10 mg to take 5-10 days each month to withdrawal if she does not spontaneously has menses. If FSH is elevated she may need to be placed back on the low dose oral contraceptive pill to provide her with the estrogen to her body needs. We'll check a comprehensive metabolic panel due to her abdominal bloating. We'll check her TSH and prolactin as well as a CBC, cholesterol and urinalysis. We discussed the new pass meter screening guidelines and she will not need 1 for 2 more  years. A requisition was given to her to schedule her mammogram. Due to limited pelvic exam because of her vaginismus we'll schedule an ultrasound for next week and then we can sit down review the results of her labs as well. I've given her the name of one of the dermatologist at [facility] hospital for her to make an appointment for a consult to discuss her persistent melasma which has not responded to different treatments offered by several dermatologist in the community. A requisition was also given her to schedule her mammogram. Her wet prep today demonstrated evidence of bacterial vaginosis and she will be placed on Flagyl 500 mg twice a day for 5 days. After all evaluation is completed she may need a follow also with her gastroenterologist for her bloating sensation in the event she may have underlying IBS. All the above discussed in Spanish and we'll follow accordingly.   Ok Edwards MD, 11:14 AM 03/10/2012

## 2012-03-11 LAB — WET PREP FOR TRICH, YEAST, CLUE
Trich, Wet Prep: NONE SEEN
Yeast Wet Prep HPF POC: NONE SEEN

## 2012-03-13 NOTE — Progress Notes (Signed)
Addended by: Bertram Savin A on: 03/13/2012 12:19 PM   Modules accepted: Orders

## 2012-03-18 ENCOUNTER — Ambulatory Visit (INDEPENDENT_AMBULATORY_CARE_PROVIDER_SITE_OTHER): Payer: BC Managed Care – PPO | Admitting: Gynecology

## 2012-03-18 ENCOUNTER — Encounter: Payer: Self-pay | Admitting: Gynecology

## 2012-03-18 ENCOUNTER — Ambulatory Visit (INDEPENDENT_AMBULATORY_CARE_PROVIDER_SITE_OTHER): Payer: BC Managed Care – PPO

## 2012-03-18 VITALS — BP 120/82

## 2012-03-18 DIAGNOSIS — N942 Vaginismus: Secondary | ICD-10-CM

## 2012-03-18 DIAGNOSIS — N83209 Unspecified ovarian cyst, unspecified side: Secondary | ICD-10-CM

## 2012-03-18 DIAGNOSIS — R140 Abdominal distension (gaseous): Secondary | ICD-10-CM

## 2012-03-18 DIAGNOSIS — N8300 Follicular cyst of ovary, unspecified side: Secondary | ICD-10-CM

## 2012-03-18 DIAGNOSIS — R143 Flatulence: Secondary | ICD-10-CM

## 2012-03-18 DIAGNOSIS — R141 Gas pain: Secondary | ICD-10-CM

## 2012-03-18 DIAGNOSIS — R3129 Other microscopic hematuria: Secondary | ICD-10-CM

## 2012-03-18 DIAGNOSIS — E162 Hypoglycemia, unspecified: Secondary | ICD-10-CM

## 2012-03-18 LAB — GLUCOSE, RANDOM: Glucose, Bld: 85 mg/dL (ref 70–99)

## 2012-03-18 MED ORDER — LEVONORGESTREL-ETHINYL ESTRAD 0.1-20 MG-MCG PO TABS: 1.0000 | ORAL_TABLET | Freq: Every day | ORAL | Status: DC

## 2012-03-18 NOTE — Patient Instructions (Signed)
Quiste ovrico (Ovarian Cyst) Los ovarios son pequeos rganos que se encuentran a cada lado del tero. Los ovarios son los rganos que producen las hormonas femeninas, estrgeno y progesterona. Un quiste en el ovario es una bolsa llena de lquido que puede variar en tamao. Es normal que se formen pequeos quistes en las mujeres en edad de procrear y que an tienen sus perodos menstruales. Este tipo de quiste se denomina quiste folicular que se transforma en un quiste ovulatorio (quiste del cuerpo lteo) despus de producir los vulos. Si la mujer no queda embarazada, desaparece sin ninguna intervencin. Existen otros tipos de quistes de ovario que pueden causar problemas y necesitan ser tratados. El problema ms grave es que el quiste sea canceroso. Debe advertirse que en las mujeres menopusicas que presentan un quiste de ovario, existe un mayor riesgo de que ese quiste sea canceroso. Deben evaluarse muy rpida y cuidadosamente, y controlarse adecuadamente. Esto es ms importante en las mujeres menopusicas debido al elevado porcentaje de cncer de ovario durante este perodo. CAUSAS Y TIPOS DE CNCER DE OVARIO:  QUISTE FUNCIONAL: El quiste de folculo o cuerpo lteo es un quiste funcional que aparece todos los meses durante la ovulacin, con el ciclo menstrual. Si la mujer no queda embarazada, desaparecen con el prximo ciclo menstrual. Generalmente los quistes funcionales no presentan sntomas.   ENDOMETRIOMA: este quiste aparece en la superficie del tejido del tero. Un quiste se forma en el interior o sobre el ovario. Cada mes se desarrolla un poco ms debido a la sangre del perodo menstrual. Tambin se denomina "quiste de chocolate" debido a que est lleno de sangre que se vuelve color marrn. Este tipo de quiste causa dolor en la zona inferior del abdomen durante las relaciones sexuales y durante el perodo menstrual.   CISTADENOMA: Se desarrolla a partir de las clulas externas del ovario.  Generalmente no son cancerosos. Pueden llegar a ser de gran tamao y causar dolor en la zona baja del abdomen y durante las relaciones sexuales. Este tipo de quiste puede retorcerse e interrumpir el flujo de sangre, lo que causa un dolor muy intenso. Tambin puede romperse y causar mucho dolor.   QUISTE DERMOIDE: generalmente este tipo de quiste aparece en ambos ovarios. Puede haber diferentes tipos de tejidos en el quiste. Por ejemplo tejidos de piel, dientes, pelos o cartlago. En general no dan sntomas, excepto que sean muy grandes. Los quistes dermoides rara vez son cancerosos.   OVARIO POLIQUSTICO: es una enfermedad rara relacionada con trastornos hormonales que produce muchos quistes pequeos en ambos ovarios. Estos quistes son similares a los quistes de folculo pero nunca producen vulos y se transforman en cuerpo lteo. Pueden causar aumento del peso corporal, infertilidad, acn, aumento del vello facial y corporal y falta de perodos menstruales o perodos anormales. Muchas mujeres que sufren este problema presentan diabetes tipo 2. La causa exacta de este problema es desconocida. Un ovario poliqustico rara vez es canceroso.   QUISTE OVRICO TECALUTESTICO Aparece cuando hay demasiada hormona (gonadotrofina corinica humana), la que sobreestimula al ovario para producir vulos. Se observan con frecuencia cuando el mdico estimula los ovarios para la fertilizacin in vitro (bebs de probeta).   QUISTE LUTENICO: Aparece durante el embarazo. En algunos casos raros, produce una obstruccin del canal de parto. Generalmente desaparece despus del parto.  SNTOMAS  Dolor o molestias en la pelvis.   Dolor durante las relaciones sexuales.   Aumento de la inflamacin en el abdomen.   Perodos menstruales anormales.     Aumento del dolor en los perodos menstruales.   Deja de menstruar y no est embarazada.  DIAGNSTICO El diagnstico puede realizarse durante:  Los exmenes plvicos anuales  o de rutina (frecuente).   Ecografas   Radiografas de la pelvis.   Tomografa computada   Resonancia magntica..   Anlisis de sangre.  TRATAMIENTO  El tratamiento slo consiste en que el mdico controle el quiste mensualmente, durante 2  3 meses. Muchos desaparecen espontnemente, especialmente los quistes funcionales.   Puede aspirarse (secarse) con una aguja larga observndolo en una ecografa, o por laparoscopa (insertando un tubo en la pelvis a travs de una pequea incisin).   El quiste puede extirparse con laparoscopa.   En algunos casos es necesario extirparlo a travs de una incisin en la zona inferior del abdomen.   El tratamiento hormonal se utiliza para disolver ciertos tipos de quiste.   Las pldoras anticonceptivas pueden utilizarse para disolver otros tipos.  INSTRUCCIONES PARA EL CUIDADO DOMICILIARIO Siga las indicaciones del profesional con respecto a:  Medicamentos   Visitas de control para evaluar y tratar el quiste.   Puede ser necesario que tenga que volver o concertar una cita con otro profesional para descubrir la causa exacta del quiste, si su mdico no es gineclogo.   Realice un examen plvico y un [place] todos los aos, segn las indicaciones.   Informe al mdico si tubo un quiste de ovario en el pasado.  SOLICITE ATENCIN MDICA SI:  Los perodos se atrasan, son irregulares, le faltan o son dolorosos.   El dolor abdominal (en el vientre) o en la pelvis persisten.   El abdomen se agranda o se hincha.   Siente una opresin en la vejiga o tiene problemas para vaciarla completamente.   Tiene dolor durante las relaciones sexuales.   Tiene la sensacin de hinchazn, presin o molestias en el abdomen.   Pierde peso sin razn aparente.   Siente un malestar generalizado.   Est constipada.   Pierde el apetito.   Aparece acn.   Aumenta el vello facial y corporal.   Aumenta de peso sin hacer modificaciones en su actividad  fsica y en su dieta habitual.   Sospecha que est embarazada.  SOLICITE ATENCIN MDICA DE INMEDIATO SI:  Siente dolor abdominal cada vez ms intenso.   Si tiene ganas de vomitar (nuseas).   Le sube repentinamente la fiebre.   Siente dolor abdominal al mover el intestino.   Sus perodos menstruales son ms abundantes que lo habitual.  Document Released: 08/07/2005 Document Revised: 10/17/2011 [employer] Patient Information 2012 [employer]. 

## 2012-03-18 NOTE — Progress Notes (Signed)
Patient is a 41 year old was seen the office on April of 30 for her annual gynecological examination see previous note for further detail. Patient with prior history of premature ovarian failure with extensive workup. Patient had been on Aviane oral contraceptive pill but stopped it for several months. And she and had some spotting at times and for this reason we had done an [facility] which once again demonstrated that it was in the menopausal range (63.4). Her prolactin level was normal but her conference metabolic panel demonstrated her blood sugar of low at 65. She had a normal TSH. Patient has had a previous tubal sterilization procedure She was asked to come in today because she was feeling bloated and an ultrasound was done with the following findings:  Uterus measured 8.5 x 5.8 x 3.3 cm with an endometrial stripe of 2.8 mm an apparent arcuate versus a sub-septate uterus was noted. Right ovary was normal. Left ovary thinwall echo-free cyst measuring 3.1 x 2.8 x 2.6 cm avascular and minimal fluid in the cul-de-sac. Endometrial stripe 2.8 mm  Assessment/plan: Patient with history of premature ovarian failure had been on low dose 28 day oral contraceptive pill (Aviane) and will be instructed to begin to take it again today. Patient with a small left ovarian cyst probably functional will be asked to return back in 3 months for followup ultrasound. She is fasting today and we will  be checking her fasting blood sugar due to the fact that her random blood sugar recently was in the hypoglycemic range. We are also repeating her urine because there was evidence of microscopic hematuria. Patient denies any dysuria frequency or any back pain. Patient's in the process of scheduling her mammogram and also to followup with her dermatologist at [facility] for her persistence of melasma.

## 2012-03-19 LAB — URINALYSIS W MICROSCOPIC + REFLEX CULTURE
Casts: NONE SEEN
Crystals: NONE SEEN
Leukocytes, UA: NEGATIVE
Nitrite: NEGATIVE
Specific Gravity, Urine: 1.013 (ref 1.005–1.030)
Urobilinogen, UA: 0.2 mg/dL (ref 0.0–1.0)
pH: 6 (ref 5.0–8.0)

## 2012-03-23 ENCOUNTER — Encounter (HOSPITAL_COMMUNITY): Payer: Self-pay | Admitting: Emergency Medicine

## 2012-03-23 ENCOUNTER — Emergency Department (HOSPITAL_COMMUNITY): Payer: BC Managed Care – PPO

## 2012-03-23 ENCOUNTER — Emergency Department (HOSPITAL_COMMUNITY)
Admission: EM | Admit: 2012-03-23 | Discharge: 2012-03-23 | Disposition: A | Discharge status: Home / Self Care | Payer: BC Managed Care – PPO | Attending: Emergency Medicine | Admitting: Emergency Medicine

## 2012-03-23 DIAGNOSIS — Z79899 Other long term (current) drug therapy: Secondary | ICD-10-CM | POA: Insufficient documentation

## 2012-03-23 DIAGNOSIS — E2839 Other primary ovarian failure: Secondary | ICD-10-CM | POA: Insufficient documentation

## 2012-03-23 DIAGNOSIS — S9030XA Contusion of unspecified foot, initial encounter: Secondary | ICD-10-CM

## 2012-03-23 DIAGNOSIS — W208XXA Other cause of strike by thrown, projected or falling object, initial encounter: Secondary | ICD-10-CM | POA: Insufficient documentation

## 2012-03-23 DIAGNOSIS — L819 Disorder of pigmentation, unspecified: Secondary | ICD-10-CM | POA: Insufficient documentation

## 2012-03-23 MED ORDER — NAPROXEN 500 MG PO TABS: 500.0000 mg | ORAL_TABLET | Freq: Two times a day (BID) | ORAL | Status: DC

## 2012-03-23 NOTE — Discharge Instructions (Signed)
Contusion  A contusion is a deep bruise. Contusions happen when an injury causes bleeding under the skin. Signs of bruising include pain, puffiness (swelling), and discolored skin. The contusion may turn blue, purple, or yellow.  HOME CARE    Put ice on the injured area.   Put ice in a plastic bag.   Place a towel between your skin and the bag.   Leave the ice on for 15 to 20 minutes, 3 to 4 times a day.   Only take medicine as told by your doctor.   Rest the injured area.   If possible, raise (elevate) the injured area to lessen puffiness.  GET HELP RIGHT AWAY IF:    You have more bruising or puffiness.   You have pain that is getting worse.   Your puffiness or pain is not helped by medicine.  MAKE SURE YOU:    Understand these instructions.   Will watch your condition.   Will get help right away if you are not doing well or get worse.  Document Released: 04/15/2008 Document Revised: 10/17/2011 Document Reviewed: 09/02/2011  ExitCare Patient Information 2012 [employer].

## 2012-03-23 NOTE — ED Provider Notes (Signed)
History     CSN: [id number]  Arrival date & time 03/23/12  4098   First MD Initiated Contact with Patient 03/23/12 2219      Chief Complaint  Patient presents with  . Foot Injury    HPI Pt had a brick fall on her foot this morning.  She states since that happened she has had persistent pain in her right foot and has noticed swelling on the dorsal aspect of her foot. Patient states the pain is moderate. It increases with palpation and movement. She has not tried taking anything for the pain. Patient denies any numbness or weakness. There are no other injuries. Past Medical History  Diagnosis Date  . Premature ovarian failure   . Chloasma     Past Surgical History  Procedure Date  . Tubal ligation   . Cesarean section     x4    Family History  Problem Relation Age of Onset  . Diabetes Maternal Aunt     History  Substance Use Topics  . Smoking status: Never Smoker   . Smokeless tobacco: Never Used  . Alcohol Use: No    OB History    Grav Para Term Preterm Abortions TAB SAB Ect Mult Living   4 4 3 1      4       Review of Systems  All other systems reviewed and are negative.    Allergies  Review of patient's allergies indicates no known allergies.  Home Medications   Current Outpatient Rx  Name Route Sig Dispense Refill  . CALCIUM CARBONATE 600 MG PO TABS Oral Take 600 mg by mouth daily.     . MULTI-VITAMIN/MINERALS PO TABS Oral Take 1 tablet by mouth daily.     Marland Kitchen NAPROXEN 500 MG PO TABS Oral Take 1 tablet (500 mg total) by mouth 2 (two) times daily. 30 tablet 0    BP 108/55  Pulse 78  Temp(Src) 97.6 F (36.4 C) (Oral)  Resp 18  Wt 114 lb (51.71 kg)  SpO2 100%  LMP 01/14/2012  Physical Exam  Nursing note and vitals reviewed. Constitutional: She appears well-developed and well-nourished. No distress.  HENT:  Head: Normocephalic and atraumatic.  Right Ear: External ear normal.  Left Ear: External ear normal.  Eyes: Conjunctivae are normal. Right  eye exhibits no discharge. Left eye exhibits no discharge. No scleral icterus.  Neck: Neck supple. No tracheal deviation present.  Cardiovascular: Normal rate.   Pulmonary/Chest: Effort normal. No stridor. No respiratory distress.  Musculoskeletal: She exhibits edema and tenderness.       Right foot: She exhibits tenderness, bony tenderness and swelling. She exhibits no deformity and no laceration.  Neurological: She is alert. Cranial nerve deficit: no gross deficits.  Skin: Skin is warm and dry. No rash noted.  Psychiatric: She has a normal mood and affect.    ED Course  Procedures (including critical care time)  Labs Reviewed - No data to display Dg Foot Complete Right  03/23/2012  *RADIOLOGY REPORT*  Clinical Data: A brick fell on the patient's foot.  Medial pain.  RIGHT FOOT COMPLETE - 3+ VIEW  Comparison: None.  Findings: No malalignment at the Lisfranc joint is observed.  No definite sesamoid discontinuity is currently present.  The metatarsals appear intact.  There is a suggestion of mild spurring along the distal medial margin of the medial cuneiform, without observed cortical discontinuity/fracture.  IMPRESSION:  1.  No fracture or dislocation is observed.   If symptoms persist  despite conservative therapy, MRI followup may be warranted.  Original Report Authenticated By: Dellia Cloud, M.D.    1. Contusion of foot     MDM  Will give pt crutches and ice pack.  Follow up with orthopedics if not improving.  contusion of foot without fracture        Celene Kras, MD 03/23/12 2246

## 2012-03-23 NOTE — ED Notes (Signed)
PT. REPORTS A BRICK ACCIDENTALLY FELL ON HER RIGHT FOOT THIS MORNING , PRESENTS WITH PAIN AND SWELLING AT RIGHT FOOT.

## 2012-03-23 NOTE — ED Notes (Signed)
Discharge instructions reviewed with pt; verbalizes understanding.  No questions asked; no further c/o's noted.  Pt awaiting Ortho to crutches.

## 2012-04-13 ENCOUNTER — Ambulatory Visit: Payer: Self-pay | Admitting: Internal Medicine

## 2012-04-13 DIAGNOSIS — Z0289 Encounter for other administrative examinations: Secondary | ICD-10-CM

## 2012-06-01 ENCOUNTER — Ambulatory Visit (INDEPENDENT_AMBULATORY_CARE_PROVIDER_SITE_OTHER): Payer: BC Managed Care – PPO | Admitting: Gynecology

## 2012-06-01 ENCOUNTER — Encounter: Payer: Self-pay | Admitting: Gynecology

## 2012-06-01 VITALS — BP 108/78

## 2012-06-01 DIAGNOSIS — K644 Residual hemorrhoidal skin tags: Secondary | ICD-10-CM

## 2012-06-01 DIAGNOSIS — K5900 Constipation, unspecified: Secondary | ICD-10-CM

## 2012-06-01 DIAGNOSIS — N898 Other specified noninflammatory disorders of vagina: Secondary | ICD-10-CM

## 2012-06-01 LAB — WET PREP FOR TRICH, YEAST, CLUE
Clue Cells Wet Prep HPF POC: NONE SEEN
Trich, Wet Prep: NONE SEEN
Yeast Wet Prep HPF POC: NONE SEEN

## 2012-06-01 MED ORDER — METRONIDAZOLE 500 MG PO TABS: 500.0000 mg | ORAL_TABLET | Freq: Two times a day (BID) | ORAL | Status: AC

## 2012-06-01 MED ORDER — LIDOCAINE-HYDROCORTISONE ACE 2.8-0.55 % RE GEL: RECTAL | Status: DC

## 2012-06-01 MED ORDER — POLYETHYLENE GLYCOL 3350 17 GM/SCOOP PO POWD: ORAL | Status: DC

## 2012-06-01 NOTE — Patient Instructions (Addendum)
Constipacin en los adultos (Constipation in Adults) Se denomina constipacin al hecho de mover el intestino menos de dos veces por semana. Generalmente las heces son duras. A medida que envejecemos, la constipacin es ms frecuente. Si trata de solucionarlo con laxantes, puede empeorar el problema. Los laxantes utilizados durante largos perodos pueden [employer] msculos del colon. Esto har que la constipacin empeore gradualmente. Una dieta pobre en fibras, la ingesta insuficiente de lquidos y algunos medicamentos pueden empeorar el problema. ALGUNOS MEDICAMENTOS QUE PUEDEN CAUSAR CONSTIPACIN SON:  Diurticos.   Boqueadotes de los canales de calcio (medicamento utilizado para Chief Operating Officer la presin arterial y el funcionamiento cardaco.   Narcticos (ciertos medicamentos para Chief Technology Officer).   Anticolinrgicos.   Antiinflamatorios.   Anticidos que contengan aluminio.  ALGUNOS MEDICAMENTOS QUE PUEDEN CAUSAR CONSTIPACIN SON:   Diabetes.   Enfermedad de Parkinson.   Demencia (la mente no funciona adecuadamente y existen trastornos del pensamiento).   Ictus.   Depresin.   Otras enfermedades que causan dificultades con el metabolismo de sales y lquidos.  INSTRUCCIONES PARA EL CUIDADO DOMICILIARIO  El mejor tratamiento para la constipacin es el que se realiza sin tomar medicamentos. Es importante consumir ms fibras, frutas y Sports administrator.   Aumente lentamente el consumo de fibras a 25 a 38 gramos por da. Granos integrales, frutas, verduras y legumbres son buenas fuentes de [place]. Un dietista podr ayudarlo a incorporar alimentos altos en fibra en su dieta.   Beba gran cantidad de lquido para mantener la orina de tono claro o color amarillo plido.   Deber aadir un suplemento de fibra en su dieta si no puede recibir la cantidad suficiente a partir de los alimentos.   Aumentar la actividad fsica tambin ayuda a mejorar la regularidad.   Los supositorios, segn lo haya  indicado el mdico, podrn ser de utilidad. Si toma anticidos u otros productos que contengan aluminio o calcio, los que pueden causar constipacin, ser de gran ayuda cambiarlos por productos que contengan magnesio, con la aprobacin del mdico.   Si en el da de hoy le han aplicado un enema, considere que esta slo es una medida temporaria y no debe considerarse como tratamiento para la constipacin de Set designer data (crnica). Si utiliza enemas [employer], se debilitarn los msculos del colon y la Quarry manager.   Tambin deben evitarse en lo posible medidas ms enrgicas, como el consumo de sulfato de magnesio, siempre que sea posible. El sulfato de magnesio puede causar diarrea incontrolable. Sin embargo, si usted es una persona de edad [place], estas medidas pueden ser tentadoras. En algunos casos, este tipo de medidas ni siquiera le dan tiempo para llegar al bao.  SOLICITE ATENCIN MDICA DE INMEDIATO SI:  Observa sangre de color rojo brillante en las heces.   El estreimiento persiste durante ms de [employer]   Presenta dolor abdominal o rectal junto con el estreimiento.   No parece sentirse mejor.   Tiene preguntas para formular, o alguna preocupacin, o no mejora.  EST SEGURO QUE:   Comprende las instrucciones para el alta mdica.   Controlar su enfermedad.   Solicitar atencin mdica de inmediato segn las indicaciones.  Document Released: 11/17/2007 Document Revised: 10/17/2011 [facility] Patient Information 2012 [place], [place].Hemorroides  (Hemorrhoids) Las hemorroides son venas agrandadas (dilatadas) alrededor del recto. Hay 2 tipos de hemorroides, que se determina por su ubicacin. Las hemorroides internas, que son las que se encuentran dentro del recto.Generalmente no duelen, Charity fundraiser.Sin embargo, pueden hincharse y Gaffer por  el recto, entonces se irritan y duelen. Las hemorroides externas incluyen las venas externas del ano, y se  sienten como un bulto duro y que duele, cerca del ano.Muchas veces pican, y pueden romperse y Geophysicist/field seismologist. En algunos casos se forman cogulos en las venas. Esto hace que se hinchen y duelan. Esto se suele denominar trombosis hemorroidal.  CAUSAS  Las causas de hemorroides son:   Vanetta Mulders. El embarazo aumenta la presin en las venas hemorroidales.   Constipacin   Dificultad para mover el intestino   Obesidad.   Levantar pesas u otras actividades que impliquen esfuerzo.  TRATAMIENTO  La mayora de los casos de hemorroides mejoran en 1 a 2 semanas. Sin embargo, si los sntomas no mejoran o tiene Runner, broadcasting/film/video, el mdico puede Education officer, environmental un procedimiento para disminuir las hemorroides o extirparlas completamente.Los tratamientos posibles son:   Abigail Butts con Neomia Dear banda de goma. Se coloca una banda de goma en la base de la hemorroides para cortar la circulacin.   Escleroterapia Se inyecta una sustancia qumica para disminuir el tamao de la hemorroides.   Terapia con luz infrarroja. Se utiliza un instrumento para quemar la hemorroides.   Hemorroidectoma Es la remocin quirrgica de la hemorroides.  INSTRUCCIONES PARA EL CUIDADO EN EL HOGAR   Agregue fibra a su dieta. Consulte con su mdico acerca del uso de suplementos con fibras.   Beba gran cantidad de lquido para mantener la orina de tono claro o color amarillo plido.   Haga ejercicios regularmente.   Vaya al bao cuando sienta la necesidad de mover el intestino. Noespere.   Evite hacer fuerza al mover el intestino.   Mantenga la zona anal limpia y seca.   Solo tome medicamentos que se pueden comprar sin receta o recetados para Chief Technology Officer, Dentist o fiebre, como le indica el mdico.  Si la hemorroides est trombosada:   Tome baos de asiento calientes durante 20 a 30 minutos, 3 a 4 veces por da.   Si le duele y est hinchada, coloque compresas con hielo en la zona, segn la tolerancia. Usar las compresas de [employer]  baos de asiento puede ser [place]. Llene una bolsa plstica con hielo. Coloque una toalla entre la bolsa de hielo y la piel.   Puede usar o Contractor segn las indicaciones algunas cremas especiales y supositorios.   No utilice una almohada en forma de aro ni se siente en el inodoro durante perodos prolongados. Esto aumenta la afluencia de sangre y Chief Technology Officer.  SOLICITE ATENCIN MDICA SI:   Aumenta el dolor y la hinchazn, y no puede controlarlo con Tourist information centre manager.   Tiene un sangrado que no puede parar.   No puede mover el intestino.   Siente dolor o tiene inflamacin fuera de la zona de las hemorroides.   Tiene escalofros o una temperatura oral mayor a 102 F (38.9 C).  ASEGRESE DE QUE:   Comprende estas instrucciones.   Controlar su enfermedad.   Solicitar ayuda de inmediato si no mejora o si empeora.  Document Released: 10/28/2005 Document Revised: 10/17/2011 [facility] Patient Information 2012 [place], [place].

## 2012-06-01 NOTE — Progress Notes (Signed)
Patient presented to the office today complaining of constipation whereby she would have bowel movements only 3 times a week. From straining she has developed hemorrhoids which is very painful and tender. She also thinks is he may have a slight vaginal discharge. She is in a monogamous relationship.  Exam: Pelvic: Bartholin urethra Skene was within normal limits Vagina: No gross lesions slight watery discharge no odor Cervix: No lesions or discharge Rectal: External non-thrombosed hemorrhoids  Wet prep few WBC too numerous to count bacteria  Assessment/plan #1 constipation patient will be placed on MiraLax 1 tablespoon with juice daily. Patient was encouraged to increase her fiber intake and fluid intake. #2 for her external nonthrombosed hemorrhoids she will be prescribed Rectagel  to apply 3 times a day for the next 3-5 days when necessary. #3 for her bacterial vaginosis she was prescribed Flagyl 500 mg take one by mouth twice a day for 5 days.

## 2012-07-30 ENCOUNTER — Other Ambulatory Visit: Payer: Self-pay | Admitting: Gynecology

## 2012-07-30 DIAGNOSIS — N83209 Unspecified ovarian cyst, unspecified side: Secondary | ICD-10-CM

## 2012-08-03 ENCOUNTER — Ambulatory Visit: Payer: BC Managed Care – PPO | Admitting: Gynecology

## 2012-08-03 ENCOUNTER — Ambulatory Visit (INDEPENDENT_AMBULATORY_CARE_PROVIDER_SITE_OTHER): Payer: BC Managed Care – PPO

## 2012-08-03 ENCOUNTER — Encounter: Payer: Self-pay | Admitting: Gynecology

## 2012-08-03 ENCOUNTER — Other Ambulatory Visit: Payer: BC Managed Care – PPO

## 2012-08-03 ENCOUNTER — Ambulatory Visit (INDEPENDENT_AMBULATORY_CARE_PROVIDER_SITE_OTHER): Payer: BC Managed Care – PPO | Admitting: Gynecology

## 2012-08-03 VITALS — BP 106/68

## 2012-08-03 DIAGNOSIS — N83202 Unspecified ovarian cyst, left side: Secondary | ICD-10-CM | POA: Insufficient documentation

## 2012-08-03 DIAGNOSIS — Q5122 Other partial doubling of uterus: Secondary | ICD-10-CM

## 2012-08-03 DIAGNOSIS — N83209 Unspecified ovarian cyst, unspecified side: Secondary | ICD-10-CM

## 2012-08-03 DIAGNOSIS — Q5128 Other doubling of uterus, other specified: Secondary | ICD-10-CM

## 2012-08-03 DIAGNOSIS — N831 Corpus luteum cyst of ovary, unspecified side: Secondary | ICD-10-CM

## 2012-08-03 NOTE — Patient Instructions (Signed)
Quiste ovrico (Ovarian Cyst) Los ovarios son pequeos rganos que se encuentran a cada lado del tero. Los ovarios son los rganos que producen las hormonas femeninas, estrgeno y Education officer, museum. Un quiste en el ovario es una bolsa llena de lquido que puede variar en tamao. Es normal que se formen pequeos quistes en las mujeres en edad de procrear y que an tienen sus perodos Designer, jewellery. Este tipo de quiste se denomina quiste folicular que se transforma en un quiste ovulatorio (quiste del cuerpo lteo) despus de producir los vulos. Si la mujer no queda embarazada, desaparece sin ninguna intervencin. Existen otros tipos de quistes de ovario que pueden causar problemas y necesitan ser tratados. El problema ms grave es que el quiste sea canceroso. Debe advertirse que en las mujeres menopusicas que presentan un quiste de ovario, existe un mayor riesgo de que ese quiste sea canceroso. Deben evaluarse muy rpida y [place], y IT sales professional. Esto es ms importante en las mujeres menopusicas debido al elevado porcentaje de cncer de ovario durante este perodo. CAUSAS Y TIPOS DE CNCER DE OVARIO:  QUISTE FUNCIONAL: El quiste de folculo o cuerpo lteo es un quiste funcional que aparece todos los meses durante la ovulacin, con el ciclo menstrual. Si la mujer no queda embarazada, desaparecen con el prximo ciclo menstrual. Generalmente los quistes funcionales no presentan sntomas.   ENDOMETRIOMA: este quiste aparece en la superficie del tejido del tero. Un quiste se forma en el interior o [employer]. Cada mes se desarrolla un poco ms debido a la sangre del perodo menstrual. Tambin se denomina "quiste de chocolate" debido a que est lleno de sangre que se vuelve color marrn. Este tipo de quiste causa dolor en la zona inferior del abdomen durante las relaciones sexuales y durante el perodo menstrual.   CISTADENOMA: Se desarrolla a partir de las clulas externas del ovario.  Generalmente no son cancerosos. Pueden llegar a ser de gran tamao y causar dolor en la zona baja del abdomen y [employer] [place] [employer] sexuales. Este tipo de quiste puede retorcerse e interrumpir el flujo de [place], lo que causa un dolor muy intenso. Tambin puede romperse y Horticulturist, commercial.   QUISTE DERMOIDE: generalmente este tipo de quiste aparece en ambos ovarios. Puede haber diferentes tipos de tejidos en el quiste. Por ejemplo tejidos de piel, dientes, pelos o cartlago. En general no dan sntomas, excepto que sean muy grandes. Los quistes dermoides rara vez son cancerosos.   OVARIO POLIQUSTICO: es una enfermedad rara relacionada con trastornos hormonales que produce muchos quistes pequeos en ambos ovarios. Estos quistes son similares a los quistes de folculo pero nunca producen vulos y se transforman en cuerpo lteo. Pueden causar aumento del [employer], infertilidad, acn, aumento del vello facial y corporal y falta de perodos menstruales o perodos anormales. Muchas mujeres que sufren este problema presentan diabetes tipo 2. La causa exacta de este problema es desconocida. Un ovario poliqustico rara vez es canceroso.   QUISTE OVRICO TECALUTESTICO Aparece cuando hay demasiada hormona (gonadotrofina corinica humana), la que sobreestimula al ovario para producir vulos. Se observan con frecuencia cuando el mdico estimula los ovarios para la fertilizacin in vitro (bebs de probeta).   QUISTE LUTENICO: Aparece durante el embarazo. En algunos casos raros, produce una obstruccin del canal de parto. Generalmente desaparece despus del parto.  SNTOMAS  Dolor o molestias en la pelvis.   Dolor durante las [employer].   Aumento de la inflamacin en el abdomen.   Perodos menstruales anormales.  Aumento del [employer] perodos [place].   Deja de menstruar y no est embarazada.  DIAGNSTICO El diagnstico puede realizarse durante:  Los exmenes plvicos anuales  o de rutina (frecuente).   Ecografas   Radiografas de la pelvis.   Tomografa computada   Resonancia magntica..   Anlisis de sangre.  TRATAMIENTO  El tratamiento slo consiste en que el mdico controle el quiste [place], durante 2  3 meses. Muchos desaparecen espontnemente, especialmente los quistes funcionales.   Puede aspirarse (secarse) con Marella Bile larga observndolo en una ecografa, o por laparoscopa (insertando un tubo en la pelvis a travs de una pequea incisin).   El quiste puede extirparse con laparoscopa.   En algunos casos es necesario extirparlo a travs de una incisin en la zona inferior del abdomen.   El tratamiento hormonal se utiliza para Restaurant manager, fast food ciertos tipos de [place].   Las pldoras anticonceptivas pueden utilizarse para Restaurant manager, fast food otros tipos.  INSTRUCCIONES PARA EL CUIDADO DOMICILIARIO Siga las indicaciones del profesional con respecto a:  Medicamentos   Visitas de control para evaluar y Pharmacologist.   Puede ser necesario que tenga que volver o concertar una cita con otro profesional para descubrir la causa exacta del quiste, si su mdico no es Research scientist (physical sciences).   Realice un examen plvico y un Papanicolau todos los aos, segn las indicaciones.   Informe al mdico si tubo un quiste de ovario en el pasado.  SOLICITE ATENCIN MDICA SI:  Los perodos se atrasan, son irregulares, le faltan o son dolorosos.   El dolor abdominal (en el vientre) o en la pelvis persisten.   El abdomen se agranda o se hincha.   Siente una opresin en la vejiga o tiene problemas para vaciarla completamente.   Tiene dolor durante las [employer].   Tiene la sensacin de hinchazn, presin o molestias en el abdomen.   Pierde peso sin razn aparente.   Siente un Engineer, maintenance (IT).   Est constipada.   Pierde el apetito.   Aparece acn.   Aumenta el vello facial y Personal assistant.   Lenora Boys de peso sin hacer modificaciones en su actividad  fsica y en su dieta habitual.   Sospecha que est embarazada.  SOLICITE ATENCIN MDICA DE INMEDIATO SI:  Siente dolor abdominal cada vez ms intenso.   Si tiene ganas de vomitar (nuseas).   Le sube repentinamente la fiebre.   Siente dolor abdominal al mover el intestino.   Sus perodos menstruales son ms abundantes que lo habitual.  Document Released: 08/07/2005 Document Revised: 10/17/2011 [facility] Patient Information 2012 [place], [place].  Laparoscopa diagnstica (Diagnostic Laparoscopy) La laparoscopa es un procedimiento quirrgico relativamente simple, de uso habitual y breve (menos de una hora) que se lleva a cabo para diagnosticar y tratar enfermedades del abdomen. El laparoscopio (tubo [place], que emite luz, del tamao de un lpiz y similar a un telescopio) se inserta en el abdomen a travs de una pequea incisin (corte realizado por un cirujano). A travs de este instrumento, el profesional podr observar [employer] rganos del interior del abdomen (vientre) y ver si hay algo anormal. La laparoscopa podr llevarse a cabo tanto en el hospital como en un consultorio. Podrn administrarle un sedante suave que lo ayudar a relajarse antes y durante el procedimiento. Una vez en la sala de operaciones, le administrarn una anestesia general (a menos que usted y el profesional elijan otro tipo de anestesia). Despus de la laparoscopa, que generalmente dura menos de Georgianne Fick, ser [employer] sala  de recuperacin durante algunas horas. Cuando regrese a Pensions consultant, la Arts administrator [employer] y [place]. RIESGOS Y COMPLICACIONES Comparados con los beneficios, los riesgos de la laparoscopia son relativamente pocos. El Animal nutritionist con usted los riesgos antes del procedimiento. Algunos problemas que pueden ocurrir luego de la intervencin son:  Infecciones.   Hemorragias.   Puede ocurrir que se lesionen otros rganos.   Efectos secundarios de  Higher education careers adviser.  PROCEDIMIENTO Una vez que se encuentra anestesiado, el cirujano insufla el abdomen con un gas inofensivo (dixido de carbono) para Research officer, political party observacin de los rganos de la pelvis. El cirujano introduce el laparoscopio a travs de una pequea incisin en el ombligo o alrededor del mismo. Podr insertar otros instrumentos, como una sonda para mover los rganos o Education officer, environmental algn procedimiento a travs de otra pequea incisin.  En ocasiones se toma una biopsia (muestra de tejido) para un diagnstico ms preciso o para Consulting civil engineer. La biopsia consiste en tomar una pequea muestra de tejido durante la laparoscopia para enviarlo al patlogo (especialista en la observacin de clulas y muestras de tejido) y que lo examine en el microscopio para un diagnstico a nivel de los tejidos. DESPUES DEL PROCEDIMIENTO  Se libera el gas del abdomen.   Las incisiones se cierran con puntos (suturas). Debido a que las incisiones son pequeas (generalmente de menos de 1 cm) las molestias son mnimas luego del procedimiento. Es posible que sienta cierto Sales executive. Es consecuencia de Education officer, community del tubo mientras se encontraba dormido. Es posible que sienta algn dolor abdominal no muy intenso. Tambin podr sentir [employer] en las incisiones realizadas para insertar los instrumentos en el abdomen.   El tiempo de recuperacin es reducido, siempre que no haya habido complicaciones.   Har reposo en la sala de recuperacin hasta que se encuentre estable y se sienta bien. Si no aparecen complicaciones, podr regresar a su casa.  AVERIGE LOS RESULTADOS DE SU ANLISIS Durante su visita no contar con todos los [employer]. En este caso, tenga otra entrevista con su mdico para conocerlos. No piense que el resultado es normal si no tiene noticias de su mdico o de la institucin mdica. Es Copy seguimiento de todos los [place]. INSTRUCCIONES PARA EL CUIDADO DOMICILIARIO  Tome todos los medicamentos tal como se le indic.   Utilice los medicamentos de venta libre o de prescripcin para Chief Technology Officer, Environmental health practitioner o la [place], segn se lo indique el profesional que lo asiste.   Reanude las actividades habituales cuando se le indique.   Es preferible que se duche y no tome baos de inmersin.   Reanude la actividad sexual luego de [place], o cuando lo autoricen.   No conduzca mientras se encuentre bajo los efectos de narcticos.  SOLICITE ATENCIN MDICA SI:  Siente un dolor abdominal inexplicable.   Siente dolor en los hombros, en la regin de los tirantes.   Si se siente aturdido o siente que se va a Artist.   Siente escalofros.   Usted o su nio tienen una temperatura oral de ms de 102 F (38.9 C).   Observa un drenaje purulento (similar al pus) que proviene de alguna de las heridas.   Usted o su hijo no puede realizar movimientos intestinales o evacuar gases.   Usted o su hijo sufren nuseas o vmitos.  EST SEGURO QUE:   Comprende las instrucciones para el alta mdica.   Controlar  su enfermedad.   Solicitar atencin mdica de inmediato segn las indicaciones.  Document Released: 10/28/2005 Document Revised: 10/17/2011 [facility] Patient Information 2012 [place], [place].

## 2012-08-03 NOTE — Progress Notes (Signed)
Kelsey Bowen is a 41 year old Kelsey Bowen with prior history of premature ovarian failure had been on oral contraceptive pills discontinuing them but stated she restarted back in on Aviane in may of this year. She was seen in may of this year stated she had some spotting at times. She had an [facility] that was repeated again which was found to be elevated with a value of 63.4 prolactin and TSH were also normal as well as a comprehensive metabolic panel and blood sugar. During that office visit she stated she felt some bloating sensation and had an ultrasound done with the following findings  Uterus measured 8.5 x 5.8 x 3.3 cm with an endometrial stripe of 2.8 mm an apparent arcuate versus a sub-septate uterus was noted. Right ovary was normal. Left ovary thinwall echo-free cyst measuring 3.1 x 2.8 x 2.6 cm avascular and minimal fluid in the cul-de-sac. Endometrial stripe 2.8 mm  She was instructed to continue on the oral contraceptive pill and to return to the office for followup ultrasound which she did today. Ultrasound today as follows  : Uterus measured 9.0 x 6.6 x 4.3 cm Kelsey Bowen with a sub-septate uterus right endometrial thickness 7.3 mm left endometrial thickness 4.3 mm. Right ovary thickwalled corpus luteum follicle measured 14 x 30 mm. Negative color flow. Left ovary thickwalled cyst measuring 21 x 21 mm with reticular echo pattern positive color flow. Echo-free cyst measuring 29 x 33 x 26 mm. A vascular. No change from prior scan. No fluid in the cul-de-sac.  Kelsey Bowen states she's been asymptomatic since she went back on oral contraceptive pill and her cycles are regular. We discussed about offering her a laparoscopy with ovarian cystectomy and she would rather wait. We will wait for 2 more months for followup with an ultrasound and if still present we'll proceed with laparoscopic cystectomy. We will do a CA 125 today. The limitations of a CA 125 were discussed. Literature information on ovarian cysts and a  laparoscopy was provided in Spanish. All questions rancher will follow cord.

## 2012-08-04 LAB — CA 125: CA 125: 5.5 U/mL (ref 0.0–30.2)

## 2012-08-07 ENCOUNTER — Encounter: Payer: Self-pay | Admitting: Gynecology

## 2012-09-25 ENCOUNTER — Other Ambulatory Visit: Payer: Self-pay | Admitting: *Deleted

## 2012-09-25 MED ORDER — LEVONORGESTREL-ETHINYL ESTRAD 0.1-20 MG-MCG PO TABS: 1.0000 | ORAL_TABLET | Freq: Every day | ORAL | Status: DC

## 2012-11-30 ENCOUNTER — Other Ambulatory Visit: Payer: Self-pay | Admitting: Gynecology

## 2012-11-30 DIAGNOSIS — N83209 Unspecified ovarian cyst, unspecified side: Secondary | ICD-10-CM

## 2012-12-03 ENCOUNTER — Ambulatory Visit (INDEPENDENT_AMBULATORY_CARE_PROVIDER_SITE_OTHER): Payer: BC Managed Care – PPO

## 2012-12-03 ENCOUNTER — Ambulatory Visit (INDEPENDENT_AMBULATORY_CARE_PROVIDER_SITE_OTHER): Payer: BC Managed Care – PPO | Admitting: Gynecology

## 2012-12-03 DIAGNOSIS — N859 Noninflammatory disorder of uterus, unspecified: Secondary | ICD-10-CM

## 2012-12-03 DIAGNOSIS — N83209 Unspecified ovarian cyst, unspecified side: Secondary | ICD-10-CM

## 2012-12-03 DIAGNOSIS — Q5122 Other partial doubling of uterus: Secondary | ICD-10-CM

## 2012-12-03 DIAGNOSIS — N858 Other specified noninflammatory disorders of uterus: Secondary | ICD-10-CM

## 2012-12-03 DIAGNOSIS — Q5128 Other doubling of uterus, other specified: Secondary | ICD-10-CM

## 2012-12-03 DIAGNOSIS — N83201 Unspecified ovarian cyst, right side: Secondary | ICD-10-CM

## 2012-12-03 MED ORDER — LEVONORGESTREL-ETHINYL ESTRAD 0.1-20 MG-MCG PO TABS: ORAL_TABLET | ORAL | Status: DC

## 2012-12-03 NOTE — Progress Notes (Signed)
Patient is a 42 year old with documented history of premature ovarian failure has been on Aviane 28 day oral contraceptive pill and has not been having normal cycles with exception of this month and was one day spotting. Review of her record indicated that we have been monitoring this simple avascular echo-free left ovarian cyst that has remained stable at seen ultrasounds in the month of May, September and on today's ultrasound. 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 Upon questioning she stated that she has had 4 prior cesarean sections as follows: #1 cesarean section in [place] due to malpresentation #2 cesarean section [place] due to malpresentation #3 [facility] as a result of malpresentation and repeat #4 [place] malpresentation. Tubal ligation  Patient was asymptomatic today. There was some fluid in the uterus at time of the ultrasound. Right ovary was normal. Left ovary thinwall echo-free avascular cyst measuring 2.8 cm average size. CA 125 normal 5.5.  Assessment/plan: With persistent small benign appearing left ovarian cyst. We are going to try to suppress the cyst with her oral contraceptive pill but to take continuously and withdrawal every 3 months and followup with an ultrasound in 6 months. If cyst persistent we've recommended proceeding at that point with laparoscopic ovarian cystectomy. We are going to try to obtain her last cesarean section reports to see if there was any description of the uterus to see if it was didelphys or by ultrasound findings would make a strong argument that she has a septated uterus. If she indeed has a Mullerian Duct Defect we had discussed a 20% of individuals may have a urological developmental abnormality and we would need to do an intravenous pyelogram or an MRI. All the above was explained in Spanish instructions were provided  and we'll follow accordingly.

## 2013-01-13 ENCOUNTER — Ambulatory Visit: Payer: BC Managed Care – PPO | Admitting: Women's Health

## 2013-01-28 ENCOUNTER — Ambulatory Visit (INDEPENDENT_AMBULATORY_CARE_PROVIDER_SITE_OTHER): Payer: BC Managed Care – PPO | Admitting: Gynecology

## 2013-01-28 ENCOUNTER — Encounter: Payer: Self-pay | Admitting: Gynecology

## 2013-01-28 VITALS — BP 114/70

## 2013-01-28 DIAGNOSIS — A499 Bacterial infection, unspecified: Secondary | ICD-10-CM

## 2013-01-28 DIAGNOSIS — B9689 Other specified bacterial agents as the cause of diseases classified elsewhere: Secondary | ICD-10-CM

## 2013-01-28 DIAGNOSIS — N760 Acute vaginitis: Secondary | ICD-10-CM

## 2013-01-28 DIAGNOSIS — N898 Other specified noninflammatory disorders of vagina: Secondary | ICD-10-CM

## 2013-01-28 MED ORDER — TINIDAZOLE 500 MG PO TABS: ORAL_TABLET | ORAL | Status: DC

## 2013-01-28 NOTE — Patient Instructions (Addendum)
Informacin para el paciente                                                                  : Vaginosis bacteriana (VB) (Conceptos Bsicos)  Qu es la vaginosis bacteriana (VB)? - La vaginosis bacteriana es una infeccin en la vagina que puede causar flujo vaginal de olor desagradable. "Flujo vaginal" es el trmino que mdicos y enfermeros usan para describir todo lquido que sale de la vagina.Las mujeres normalmente secretan una pequea cantidad de flujo vaginal todos los [place] las mujeres con vaginosis bacteriana pueden secretar mucho flujo vaginal, o un flujo vaginal que huele mal. La vaginosis bacteriana es causada por ciertas bacterias (grmenes). La vagina normalmente contiene distintos tipos de bacterias y [employer] cantidades o los tipos de bacterias [place], se puede producir una infeccin.  Las mujeres no contraen vaginosis bacteriana por Sales promotion account executive, pero las mujeres que la padecen tienen ms posibilidades de contagiarse de otras infecciones de su pareja durante la relacin sexual. Cules son los sntomas de la vaginosis bacteriana? - La mayora de las mujeres con vaginosis bacteriana no tienen sntomas. Cuando s tienen sntomas, con frecuencia tienen un flujo vaginal que "huele a pescado", el cual puede ser ms evidente despus de [employer], y ser Materials engineer, y blancuzco o gris.  Existe una prueba para detectar la vaginosis bacteriana? - S. Su mdico o Garment/textile technologist un examen y tambin tomar Lauris Poag del flujo vaginal para hacer pruebas de laboratorio en busca de infecciones. Cmo se trata la vaginosis bacteriana? - La vaginosis bacteriana se trata con medicinas. Se pueden usar [employer] distintas llamadas: Metronidazol  Clindamicina Ambas medicinas vienen en diferentes presentaciones. Pueden conseguirse en forma de pldora, gel o crema que la mujer se aplica dentro de la vagina. La [employer] de las mujeres tiene menos efectos secundarios  cuando [place] el tratamiento con gel o crema, pero usted y su mdico o enfermero deciden qu medicina y qu presentacin son indicadas para usted.  Es importante que tome toda la medicina que le recete su mdico o enfermero, incluso si sus sntomas desaparecen despus de tomar unas pocas dosis. Al tomar toda la medicina puede ayudar a prevenir que los sntomas regresen. Es necesario que mi pareja sexual se haga un tratamiento si tengo vaginosis bacteriana? - No. No es necesario que su pareja sexual se haga un tratamiento si usted tiene vaginosis bacteriana. Qu sucede si mis sntomas regresan? - Si los sntomas regresan, informe a su mdico o enfermero. Quizs necesite un tratamiento con ms medicinas.  Algunas mujeres tienen vaginosis bacteriana en forma reiterada y es posible que deban tomar medicinas durante 3 a 6 meses para prevenir infecciones futuras. Qu ocurre si estoy embarazada y tengo sntomas de vaginosis bacteriana? - Consulte a su mdico o enfermero si est embarazada y tiene sntomas de vaginosis bacteriana. Quizs necesite un tratamiento con medicinas.  Se puede prevenir la vaginosis bacteriana? - A veces s. Para prevenirla puede hacer lo siguiente: Evite las duchas vaginales (lavarse la vagina colocndose lquido para enjuagarla)  Evite tener muchas parejas sexuales  Evite fumar

## 2013-01-28 NOTE — Progress Notes (Signed)
Patient presented to the office today complaining of several days history of fishy odor especially after intercourse. Review of her record indicated in July 2013 she was treated for BV. Patient is in a monogamous relationship. Patient with past history of premature ovarian failure and recently had been found to have an ovarian cyst for which she is currently being suppressed with oral contraceptive pill continuously and will return for followup ultrasound after the 3 months. See previous note for summary dated 12/03/2012.  Exam: Bartholin urethra Skene glands: Within normal limits Vagina: Fishy like odor was noted but no true discharge Cervix: No lesions or discharge Bimanual exam: Not done rectal exam: Not done  Wet prep: Moderate amount of close cells and too numerous to count bacteria  Assessment/plan: Clinical evidence of bacterial vaginosis. Patient will be placed on Tindamax 500 mg to take 4 tablets today and 4 tablets tomorrow. We have discussed and recommended that she used a probiotic gel 2-3 times a week such as Rehresh or [place]. If in the next few months she develops another episode of bacterial vaginosis after this recommendation that we will need to place her on a 6 month treatment. Literature information was provided in [place].

## 2013-02-01 ENCOUNTER — Ambulatory Visit: Payer: Self-pay | Admitting: Gynecology

## 2013-02-26 IMAGING — CR DG FOOT COMPLETE 3+V*R*
3 series · 3 of 3 positions shown · non-contrast
Comparison: None.

CLINICAL DATA: A brick fell on the patient's foot.  Medial pain.

RIGHT FOOT COMPLETE - 3+ VIEW

[t foot ap right]
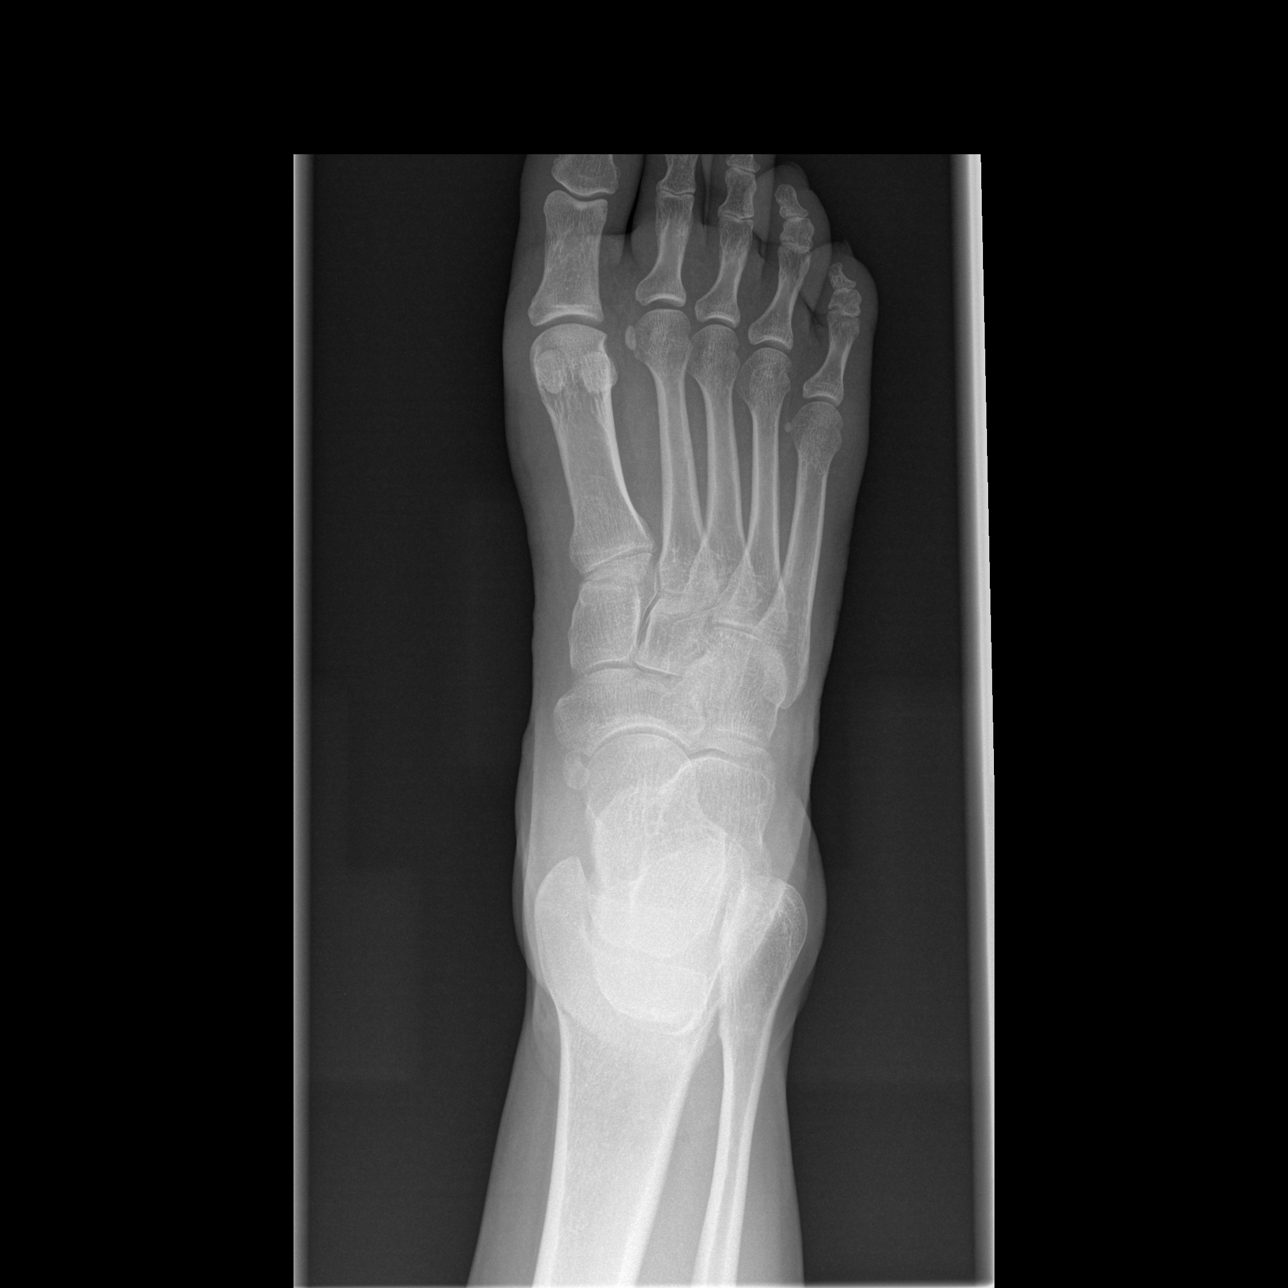

[t foot oblique right]
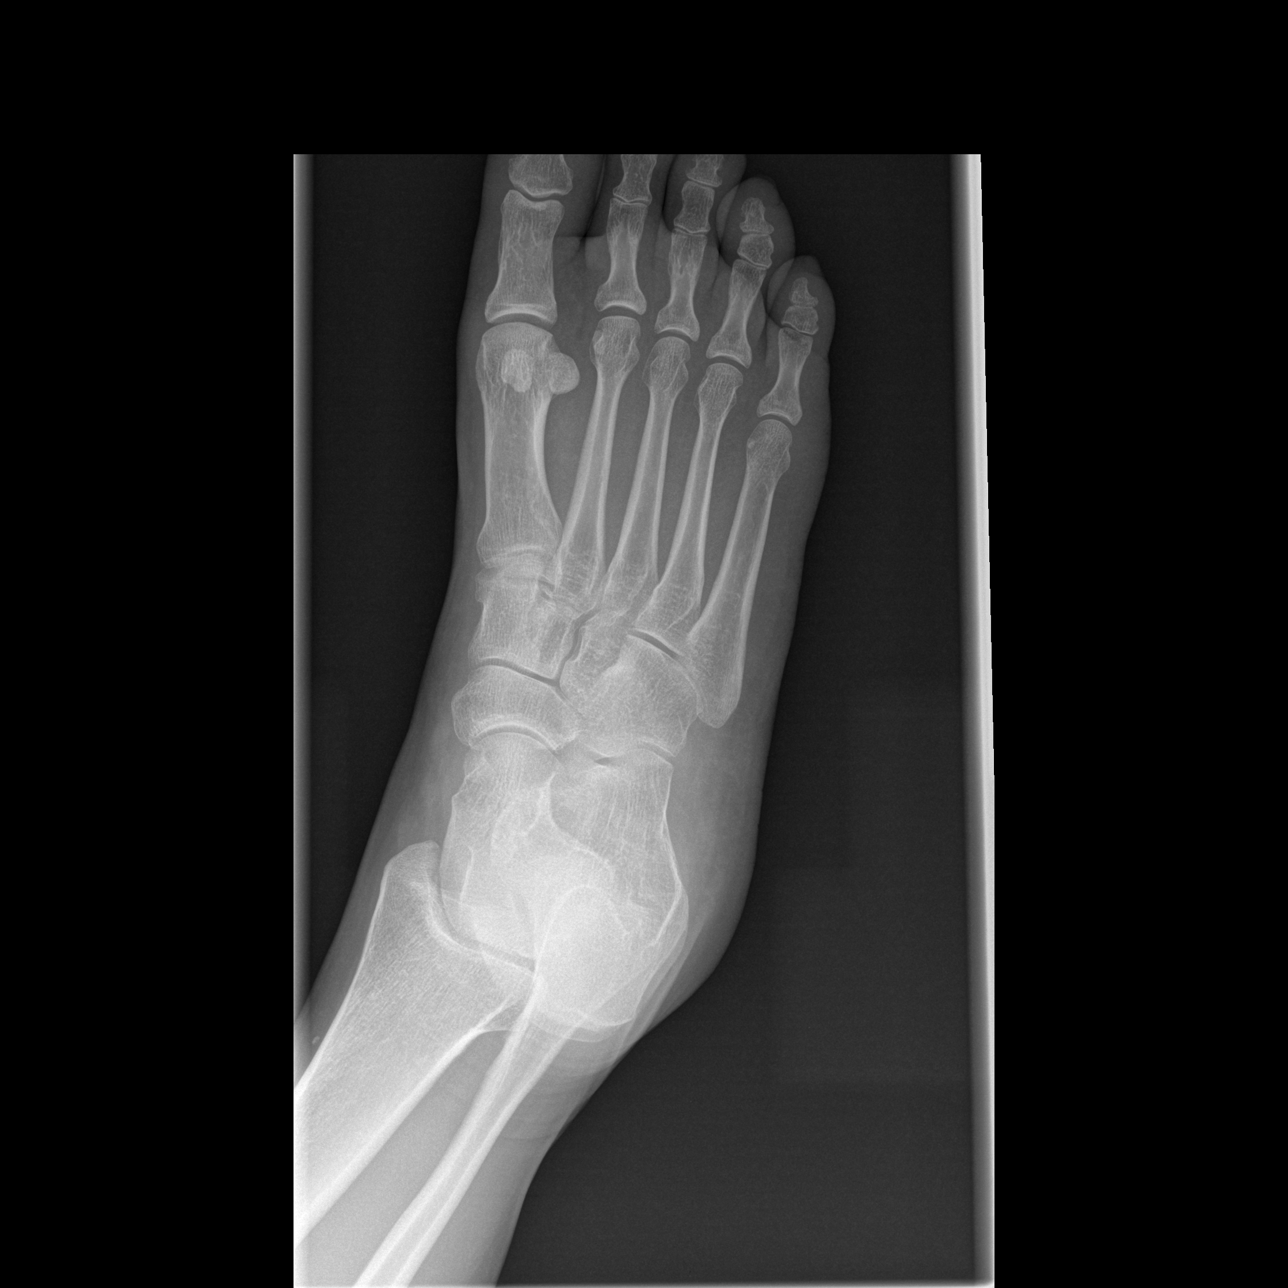

[t foot lat right]
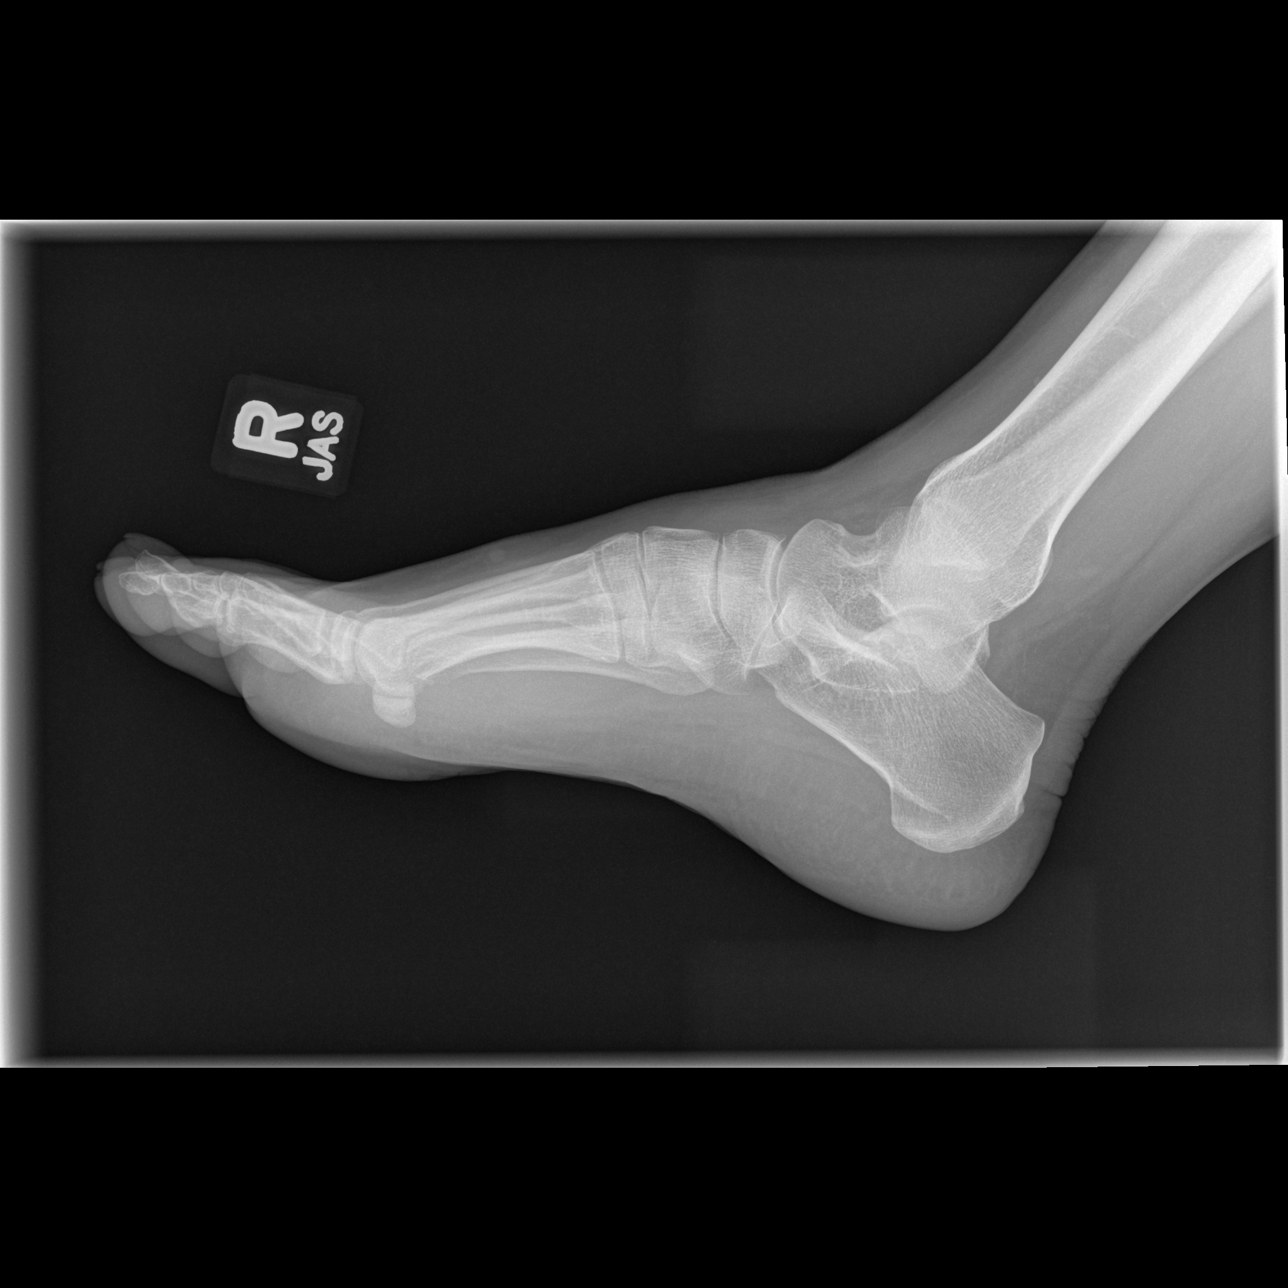

[3 of 3 positions shown; findings below may reference images not displayed]

FINDINGS: No malalignment at the Lisfranc joint is observed.  No
definite sesamoid discontinuity is currently present.  The
metatarsals appear intact.

There is a suggestion of mild spurring along the distal medial
margin of the medial cuneiform, without observed cortical
discontinuity/fracture.
IMPRESSION: 1.  No fracture or dislocation is observed.   If symptoms persist
despite conservative therapy, MRI followup may be warranted.

## 2013-05-28 ENCOUNTER — Ambulatory Visit (INDEPENDENT_AMBULATORY_CARE_PROVIDER_SITE_OTHER): Payer: BC Managed Care – PPO

## 2013-05-28 ENCOUNTER — Encounter: Payer: Self-pay | Admitting: Gynecology

## 2013-05-28 ENCOUNTER — Ambulatory Visit (INDEPENDENT_AMBULATORY_CARE_PROVIDER_SITE_OTHER): Payer: BC Managed Care – PPO | Admitting: Gynecology

## 2013-05-28 VITALS — BP 112/70

## 2013-05-28 DIAGNOSIS — N83201 Unspecified ovarian cyst, right side: Secondary | ICD-10-CM

## 2013-05-28 DIAGNOSIS — N83209 Unspecified ovarian cyst, unspecified side: Secondary | ICD-10-CM

## 2013-05-28 DIAGNOSIS — N83202 Unspecified ovarian cyst, left side: Secondary | ICD-10-CM

## 2013-05-28 NOTE — Patient Instructions (Addendum)
Laparoscopía diagnóstica  (Diagnostic Laparoscopy)  La laparoscopía es un procedimiento quirúrgico relativamente simple, de uso habitual y breve (menos de una hora) que se lleva a cabo para diagnosticar y tratar enfermedades del abdomen. El laparoscopio (tubo Quesada, que emite luz, del tamaño de un lápiz y similar a un telescopio) se inserta en el abdomen a través de una pequeña incisión (corte realizado por un cirujano). A través de este instrumento, el profesional podrá observar directamente los órganos del interior del abdomen (vientre) y ver si hay algo anormal.  La laparoscopía podrá llevarse a cabo tanto en el hospital como en un consultorio. Podrán administrarle un sedante suave que lo ayudará a relajarse antes y durante el procedimiento. Una vez en la sala de operaciones, le administrarán una anestesia general (a menos que usted y el profesional elijan otro tipo de anestesia). Después de la laparoscopía, que generalmente dura menos de una hora, será monitoreado en una sala de recuperación durante algunas horas. Cuando regrese a su casa, la recuperación completa le llevará entre dos y tres días.  RIESGOS Y COMPLICACIONES  Comparados con los beneficios, los riesgos de la laparoscopia son relativamente pocos. El profesional comentará con usted los riesgos antes del procedimiento. Algunos problemas que pueden ocurrir luego de la intervención son:  · Infecciones.  · Hemorragias.  · Puede ocurrir que se lesionen otros órganos.  · Efectos secundarios de la anestesia.  PROCEDIMIENTO  Una vez que se encuentra anestesiado, el cirujano insufla el abdomen con un gas inofensivo (dióxido de carbono) para facilitar la observación de los órganos de la pelvis. El cirujano introduce el laparoscopio a través de una pequeña incisión en el ombligo o alrededor del mismo. Podrá insertar otros instrumentos, como una sonda para mover los órganos o realizar algún procedimiento a través de otra pequeña incisión.   En ocasiones se  toma una biopsia (muestra de tejido) para un diagnóstico más preciso o para confirmar una enfermedad. La biopsia consiste en tomar una pequeña muestra de tejido durante la laparoscopia para enviarlo al patólogo (especialista en la observación de células y muestras de tejido) y que lo examine en el microscopio para un diagnóstico a nivel de los tejidos.  DESPUES DEL PROCEDIMIENTO  · Se libera el gas del abdomen.  · Las incisiones se cierran con puntos (suturas). Debido a que las incisiones son pequeñas (generalmente de menos de 1 cm) las molestias son mínimas luego del procedimiento. Es posible que sienta cierto malestar en la garganta. Es consecuencia de la colocación del tubo mientras se encontraba dormido. Es posible que sienta algún dolor abdominal no muy intenso. También podrá sentir molestias en las incisiones realizadas para insertar los instrumentos en el abdomen.  · El tiempo de recuperación es reducido, siempre que no haya habido complicaciones.  · Hará reposo en la sala de recuperación hasta que se encuentre estable y se sienta bien. Si no aparecen complicaciones, podrá regresar a su casa.  AVERIGÜE LOS RESULTADOS DE SU ANÁLISIS  Durante su visita no contará con todos los resultados de los análisis. En este caso, tenga otra entrevista con su médico para conocerlos. No piense que el resultado es normal si no tiene noticias de su médico o de la institución médica. Es importante el seguimiento de todos los resultados de los análisis.  INSTRUCCIONES PARA EL CUIDADO DOMICILIARIO  · Tome todos los medicamentos tal como se le indicó.  · Utilice los medicamentos de venta libre o de prescripción para el dolor, el malestar o la fiebre, según se   lo indique el profesional que lo asiste.  · Reanude las actividades habituales cuando se le indique.  · Es preferible que se duche y no tome baños de inmersión.  · Reanude la actividad sexual luego de una semana, o cuando lo autoricen.  · No conduzca mientras se encuentre  bajo los efectos de narcóticos.  SOLICITE ATENCIÓN MÉDICA SI:  · Siente un dolor abdominal inexplicable.  · Siente dolor en los hombros, en la región de los tirantes.  · Si se siente aturdido o siente que se va a desmayar.  · Siente escalofríos.  · Usted o su niño tienen una temperatura oral de más de 38,9° C (102° F).  · Observa un drenaje purulento (similar al pus) que proviene de alguna de las heridas.  · Usted o su hijo no puede realizar movimientos intestinales o evacuar gases.  · Usted o su hijo sufren náuseas o vómitos.  ESTÉ SEGURO QUE:   · Comprende las instrucciones para el alta médica.  · Controlará su enfermedad.  · Solicitará atención médica de inmediato según las indicaciones.  Document Released: 10/28/2005 Document Revised: 01/20/2012  Exit[employer]® Patient Information ©2014 [employer].

## 2013-05-28 NOTE — Progress Notes (Signed)
The patient is a 42 year old who presents to the office today to discuss the results of the ultrasound that was done as a result of her small persistent left ovarian cyst. Ultrasound history as follows:  03/15/2012 left ovarian cyst 3.1 x 2.8 x 2.6 cm simple echo free  September 2013 left ovarian cyst 2.9 x 3.3 x 2.6 cm simple echo free                    With 2.1 x 2.1 thick walled cyst  January 2014 left ovarian cyst 2.9 x 2.8 x 2.8 cm avascular simple cyst normal CA 125 .questionable septated uterus on ultrasound. Patient is 6 months on oral contraceptive pill would like to discontinue.  Ultrasound today as follows: Uterus measures 6.5 x 5.2 x 3.4 cm with endometrial stripe of 3.1 mm. Right ovary normal. Left ovarian ankle free thin-walled avascular cyst measuring 33 x 34 x 27 mm no fluid in the cul-de-sac no change after being on oral contraceptive pills for 6 months.  Assessment/plan: Persistent thin-walled avascular left ovarian cyst present. We had discussed laparoscopic ovarian cystectomy. Patient would like to wait for 4 more months and to repeat ultrasound at time of her anal exam if persistent Will proceed with laparoscopic ovarian cystectomy. Ligature information was provided in Spanish. We'll recheck the CA 125 today.   Patient has been asymptomatic otherwise

## 2013-06-09 ENCOUNTER — Other Ambulatory Visit: Payer: Self-pay | Admitting: Gynecology

## 2013-06-28 ENCOUNTER — Other Ambulatory Visit: Payer: Self-pay

## 2013-06-28 ENCOUNTER — Other Ambulatory Visit: Payer: Self-pay | Admitting: Gynecology

## 2013-06-28 DIAGNOSIS — Z1231 Encounter for screening mammogram for malignant neoplasm of breast: Secondary | ICD-10-CM

## 2013-07-01 ENCOUNTER — Encounter: Payer: Self-pay | Admitting: Gynecology

## 2013-07-01 ENCOUNTER — Ambulatory Visit (INDEPENDENT_AMBULATORY_CARE_PROVIDER_SITE_OTHER): Payer: BC Managed Care – PPO | Admitting: Gynecology

## 2013-07-01 VITALS — BP 110/70 | Wt 111.0 lb

## 2013-07-01 DIAGNOSIS — R232 Flushing: Secondary | ICD-10-CM

## 2013-07-01 DIAGNOSIS — N951 Menopausal and female climacteric states: Secondary | ICD-10-CM

## 2013-07-01 DIAGNOSIS — N912 Amenorrhea, unspecified: Secondary | ICD-10-CM

## 2013-07-01 DIAGNOSIS — R6882 Decreased libido: Secondary | ICD-10-CM | POA: Insufficient documentation

## 2013-07-01 LAB — TSH: TSH: 0.883 u[IU]/mL (ref 0.350–4.500)

## 2013-07-01 MED ORDER — ESTRADIOL 0.52 MG/0.87 GM (0.06%) TD GEL: 1.0000 "application " | Freq: Every day | TRANSDERMAL | Status: DC

## 2013-07-01 MED ORDER — MEDROXYPROGESTERONE ACETATE 10 MG PO TABS: ORAL_TABLET | ORAL | Status: DC

## 2013-07-01 NOTE — Patient Instructions (Addendum)
Menopausia (Menopause) La menopausia es el momento normal de la vida en que los perodos menstruales cesan completamente. Se considera definitiva cuando no ha habido perodos durante 12 meses consecutivos. Generalmente ocurre [employer] 46 y los [address], y el promedio son los 49 aos. En muy raras ocasiones se produce antes de los 40 aos. En [employer], los ovarios dejan de producir hormonas femeninas, estrgenos y Education officer, museum. Esto puede causar sntomas indeseables y Engineer, maintenance. En algunos casos los sntomas pueden ocurrir entre 4 a 5 aos antes del comienzo de la menopausia. No hay relacin entre el uso de anticonceptivos orales, el nmero de hijos que ha tenido, la raza o la edad en que los perodos menstruales comenzaron [place]ie). Las mujeres muy fumadoras y las muy delgadas pueden desarrollarla precozmente. CAUSAS  Los ovarios dejan de producir hormonas femeninas, estrgenos y Education officer, museum.  Otras causas son:  [place] en la que se extirpan ambos ovarios.  Los ovarios dejan de funcionar por causa desconocida.  Tumores en la glndula pituitaria, ubicada en el cerebro.  Enfermedades que [employer] ovarios y la produccin de hormonas.  Tratamiento de radioterapia en el abdomen o en la pelvis.  Quimioterapia que [employer]. SNTOMAS  Sofocos.  Sudoracin nocturna.  Disminucin del impulso sexual.  Sequedad vaginal y disminucin del tamao de los rganos genitales, lo que causa relaciones sexuales dolorosas.  Sequedad de la piel y aparicin de Banker.  Cefaleas.  Cansancio.  Irritabilidad.  Problemas de memoria.  Aumento de [place].  Infecciones urinarias.  Crecimiento del vello en el rostro y [place].  Infertilidad. Otros sintomas ms graves son:  Prdida de masa sea (osteoporosis), lo que causa fracturas de huesos.  Depresin.  Endurecimiento y Scientist, research (medical) de las arterias (aterosclerosis), lo que puede ocasionar infartos e  ictus. DIAGNSTICO  Cuando el perodo menstrual falta durante 12 meses corridos.  Exmenes fsicos  Estudios hormonales de [place]. TRATAMIENTO Hay muchas opciones de tratamiento y casi tantas preguntas como opciones existen. La decisin de tratar o no los cambios que trae la menopausia es una decisin que realiza el profesional de acuerdo con cada Marketing executive. El mdico comentar el tratamiento con usted. Juntos pueden decidir que tratamiento ser el mejor, por ejemplo:  Tratamiento de reemplazo hormonal.  Tratamiento de los sntomas individuales con medicamentos (por ejemplo tranquilizantes para la depresin).  Hierbas que pueden ayudar en algunos sntomas especficos.  Psicoterapia con un psiquiatra o un psiclogo.  Terapia grupal.  No recibir tratamiento. INSTRUCCIONES PARA EL CUIDADO DOMICILIARIO  Tome los medicamentos segn las indicaciones.  Descanse y duerma lo suficiente.  Practicar ejercicios con regularidad.  Consuma una dieta rica en calcio (buena para los [place]) y soja (acta como un estrgeno).  Evite las bebidas alcohlicas.  No fume.  El consumo de vitamina E puede ayudar en ciertos casos.  Si tiene sofocos, vstase en capas.  Tome suplementos de calcio y vitamina D para fortalecer los Maria Stein.  Puede usar cremas de venta libre para la sequedad vaginal.  En algunos casos es de gran ayuda la terapia grupal.  Tambin puede ser de utilidad la acupuntura. SOLICITE ANTENCIN MDICA SI:  No est segura de estar cursando la menopausia.  Tiene los sntomas y necesita consejo y [place].  Tiene perodos menstruales despus de los 55 aos.  Tiene dolor durante las [employer].  Est en la menopausia (no ha tenido perodos menstruales durante 12 meses) y desarrolla una hemorragia vaginal.  Necesita ser derivada a un especialista (gineclogo,  psiquiatra o psiclogo) para Pensions consultant. SOLICITE ATENCIN MDICA DE INMEDIATO  SI:  Sufre una depresin severa.  Tiene una hemorragia vaginal abundante.  Se ha cado y piensa que se ha roto un hueso.  Siente dolor al [employer].  Siente dolor en el pecho o en la pierna.  Siente latidos cardacos acelerados (palpitaciones)  Tiene dolores de cabeza intensos.  Desarrolla trastornos visuales.  Siente un bulto en el pecho.  Tiene dolor abdominal, o sufre una indigestin grave. Document Released: 12/09/2006 Document Revised: 01/20/2012 [facility] Patient Information 2014 [place], [place]. Terapia de reemplazo hormonal (Hormone Replacement Therapy) En la menopausia, su cuerpo comienza a producir menos estrgeno y Education officer, museum. Esto provoca que el cuerpo deje de tener perodos [place]. Esto se debe a que el estrgeno y la progesterona controlan sus perodos y su ciclo menstrual. Neomia Dear falta de estrgeno puede causar sntomas tales como:  Research scientist (life sciences).  Sequedad vaginal  Piel seca.  Prdida del deseo sexual.  Riesgo de prdida de hueso (osteoporosis). Cuando esto ocurre, puede elegir realizar Neomia Dear terapia hormonal para volver a Radiographer, therapeutic estrgeno perdido [employer]. Cuando slo se introduce esta hormona, el procedimiento se conoce normalmente como TRE (terapia de reemplazo de [place]). Cuando la hormona progestina se combina con el estrgeno, el procedimiento se conoce normalmente como TH (terapia hormonal). Esto es lo que previamente se conoca como terapia de reemplazo hormonal (TRH). El profesional que le asiste le ayudar a tomar una decisin acerca de lo que resulte lo mejor para usted. La decisin de realizar una TRH cambia a menudo debido a que se Engineer, agricultural. Muchos estudios no ponen de acuerdo con respecto a los beneficios de Education officer, environmental una terapia de reemplazo hormonal.  BENEFICIOS PROBABLES DE LA TRH QUE INCLUYEN PROTECCIN CONTRA:  Golpes de calor - Un golpe de calor es la sensacin repentina de calor sobre la cara y el cuerpo. La  piel enrojece, como al sonrojarse. Estn asociados con la transpiracin y los trastornos del sueo. Las mujeres que atraviesan la menopausia pueden tener golpes de calor unas pocas veces en el mes o varios al da; esto depende de la mujer.  Osteoporosis (prdida de hueso)  El estrgeno ayuda a protegerse contra la prdida de El Nido. Luego de la [place], los huesos de una mujer pierden calcio y se vuelven frgiles y Air traffic controller. Como resultado, es ms probable que el hueso se [place]. Los que resultan afectados con mayor frecuencia son los de la cadera, la [place] y la columna vertebral. La terapia hormonal puede ayudar a retardar la prdida de hueso luego de la menopausia. Realizar ejercicios con peso y tomar calcio con vitamina D tambin puede ayudar a prevenir la prdida de [place]. Existen medicamentos que puede prescribir el profesional que la asiste para ayudar a prevenir la osteoporosis.  Sequedad vaginal  La prdida de estrgeno produce cambios en la vagina. El recubrimiento de la misma puede volverse fino y Nurse, learning disability. Estos cambios pueden causar dolor y sangrado durante las relaciones sexuales. La sequedad tambin puede producir una infeccin. Puede ocasionarle ardor y picazn.  Las infecciones en las vas urinarias son ms comunes luego de la menopausia debido a la falta de Astronomer.  Otros beneficios posibles del estrgeno incluyen un cambio positivo en el humor y en la memoria de corto plazo en las mujeres. EFECTOS SECUNDARIOS Y RIESGOS  Utilizar estrgeno slo sin progesterona causa que el recubrimiento del tero crezca. Esto aumenta el riesgo de cncer endometrial. El profesional que la asiste deber darle Theodoro Clock  hormona llamada progestina, si usted tiene tero.  Las mujeres que realizan una TH combinada (estrgeno y progestina) parecen tener un mayor riesgo de sufrir cncer de mama. El riesgo parece ser [place], [place] aumenta a lo largo del tiempo que se realice la [place].  La terapia combinada  tambin hace que el tejido mamario sea levemente ms denso, lo que hace que sea ms difcil leer mamografas (radiografas de mama).  Combinada, la terapia de estrgeno y Education officer, museum puede realizarse todos [address], en cuyo caso podrn Social worker de [place]. La TH puede realizarse de [place] cclica, en cuyo caso tendr perodos menstruales.  La TH puede aumentar el riesgo de [place], ataque cardaco, cncer de mama y formacin de cogulos en la pierna. TRATAMIENTO  Si decide realizar Neomia Dear TH y tiene tero, normalmente se prescribe el uso de estrgeno y progestina.  El profesional que la asiste le ayudar a decidir la mejor forma de [employer].  Lo mejor es Occupational hygienist dosis posible que pueda ayudarla con sus sntomas y tomarlos durante la menor cantidad de tiempo posible.  La terapia hormonal puede ayudar a Best boy de los problemas (sntomas) que afectan a las mujeres durante la menopausia. Antes de tomar una decisin con respecto a la TH, converse con el profesional que la asiste acerca de qu es lo mejor para usted. Mantngase bien informada y sintase cmoda con sus decisiones. INSTRUCCIONES PARA EL CUIDADO DOMICILIARIO:  Siga las indicaciones del profesional con respecto a cmo Recruitment consultant.  Hgase controles de [place] regular, e incluya [place] y [place]. SOLICITE ATENCIN MDICA DE INMEDIATO SI PRESENTA:  Hemorragia vaginal anormal.  Dolor o inflamacin en las piernas, falta de aliento o Journalist, newspaper.  Mareos o dolores de [place].  Protuberancias o cambios en sus mamas o axilas.  Pronunciacin inarticulada.  Debilidad o adormecimiento en los brazos o las piernas.  Dolor, ardor o sangrado al [employer].  Dolor abdominal. Document Released: 04/15/2008 Document Revised: 01/20/2012 [employer] Patient Information 2014 [place], [place].

## 2013-07-01 NOTE — Progress Notes (Signed)
The patient is a 42 year old gravida 4 para 4 who was seen last year and was diagnosed with premature ovarian failure. FSH was 63.4. She was placed on oral contraceptive pills and discontinued approximately 3 months and has not had a menstrual cycles and is complaining of worsening hot flashes, irritability, mood swing, vaginal dryness and decreased libido. Last year her TSH and prolactin were also normal. Patient with prior history of tubal sterilization procedure many years ago.  Once again in Spanish we went through a detailed discussion of premature ovarian failure we'll reconfirm with an [facility] today but we will also repeat her TSH and prolactin today. She denies any galactorrhea, headaches, or double vision.  We also discussed the women's health initiative study as well as the risks benefits and pros and cons of hormone replacement therapy and potential risk of breast cancer. Patient does her monthly breast exam. She denies any family history of breast cancer. She's in the process of getting this years mammogram.  We discussed different types of hormone replacement therapy regimens and she has decided to proceed with Elestrin 0.06% transdermal gel to apply to one arm only at bedtime with the addition of Provera 10 mg for the first 10 days of the month.  Patient was reminded to followup in the next couple months with an ultrasound to follow the treatment of the very small avascular left ovarian cyst and measured 3.3 cm.

## 2013-07-02 LAB — PROLACTIN: Prolactin: 7.1 ng/mL

## 2013-07-09 ENCOUNTER — Ambulatory Visit: Payer: BC Managed Care – PPO

## 2013-07-29 ENCOUNTER — Ambulatory Visit: Payer: BC Managed Care – PPO

## 2013-08-16 ENCOUNTER — Ambulatory Visit: Payer: BC Managed Care – PPO

## 2013-10-29 ENCOUNTER — Ambulatory Visit (INDEPENDENT_AMBULATORY_CARE_PROVIDER_SITE_OTHER): Payer: BC Managed Care – PPO | Admitting: Gynecology

## 2013-10-29 ENCOUNTER — Encounter: Payer: Self-pay | Admitting: Gynecology

## 2013-10-29 ENCOUNTER — Telehealth: Payer: Self-pay | Admitting: *Deleted

## 2013-10-29 VITALS — BP 134/80

## 2013-10-29 DIAGNOSIS — N644 Mastodynia: Secondary | ICD-10-CM

## 2013-10-29 DIAGNOSIS — M940 Chondrocostal junction syndrome [Tietze]: Secondary | ICD-10-CM

## 2013-10-29 MED ORDER — PREDNISONE (PAK) 10 MG PO TABS: ORAL_TABLET | Freq: Every day | ORAL | Status: DC

## 2013-10-29 MED ORDER — CYCLOBENZAPRINE HCL 10 MG PO TABS: ORAL_TABLET | ORAL | Status: DC

## 2013-10-29 MED ORDER — CYCLOBENZAPRINE HCL 5 MG PO TABS: ORAL_TABLET | ORAL | Status: DC

## 2013-10-29 MED ORDER — IBUPROFEN 800 MG PO TABS: 800.0000 mg | ORAL_TABLET | Freq: Three times a day (TID) | ORAL | Status: DC | PRN

## 2013-10-29 NOTE — Telephone Encounter (Signed)
Message copied by Aura Camps on Fri Oct 29, 2013  3:49 PM ------      Message from: Keenan Bachelor      Created: Fri Oct 29, 2013  3:41 PM                   ----- Message -----         From: Ok Edwards, MD         Sent: 10/29/2013   3:04 PM           To: Keenan Bachelor            Please schedule bilateral diagnostic mammogram on this patient with bilateral breast pain inner aspect of both. Last mammogram 3 years ago. On HRT. Thanks ------

## 2013-10-29 NOTE — Progress Notes (Signed)
Patient is a 42 year old who presented to the office today complaining of bilateral breast pain for the past 3 days she describes the pain as located the medial aspect of both breast. She denies any recent trauma or injury. She denies any nipple discharge. She has history of premature ovarian failure and is on hormone replacement therapy consisting of Elestrin 0.06% transdermal gel which she applies to one arm daily with the addition of Provera 10 mg for 10 days of the month which she claims has helped her quality of life such as vasomotor symptoms tremendously. Patient has no family history of breast cancer reported. She drinks 2 cups of coffee daily and takes an power energy drink at least once or twice a week. Her last mammogram was in 2011.  Both breasts were examined in the sitting supine position. Both breasts are symmetrical in appearance no skin discoloration no nipple inversion no supraclavicular axillary lymphadenopathy patient was tender on both breasts as seen on the location described below:  Physical Exam  Pulmonary/Chest:     Patient will be referred for bilateral diagnostic mammogram. I strongly believe that she probably has underlying costochondritis [facility] not had a mammogram since 2011 we'll proceed at this time by making a diagnostic on both breasts. She will be treated as we wait for the evaluation with the following regimen:  Prednisone (Sterapred unipak) 10 mg to take over a course of 6 days. Flexeril 5 mg to take one by mouth at bedtime for the next 10 days. Motrin 800 mg 3 times a day for 7-10 days with food. All the above instructions were provided in Spanish. She was reminded to schedule her annual exam which is overdue in January a lot with a followup ultrasound.

## 2013-10-29 NOTE — Patient Instructions (Signed)
Costocondritis (Costochondritis) El profesional que lo asiste le ha diagnosticado el sndrome de la unin costocondral. La costocondritis (sndrome de Orthoptist) es la hinchazn e irritacin (inflamacin) del tejido (cartlago) que conecta las costillas con el esternn. Puede ocurrir espontneamente (sin otra causa), por un traumatismo (lesin causada por un accidente) o simplemente al toser, o luego de un ejercicio de poca intensidad. Podr tardar Kelsey Bowen 6 semanas en curarse, y ms si no puede restringir sus actividades.  INSTRUCCIONES PARA EL CUIDADO DOMICILIARIO  Evite la actividad fsica extenuante. Trate de no esforzar las [employer] actividades habituales. Aqu se incluyen las [address] en las que [place] los msculos del pecho, abdominales (el vientre) y los Financial planner, especialmente si debe Artist.  Aplique hielo durante 15 a 20 minutos por hora mientras se encuentre despierto, durante los 2 primeros das. Coloque el hielo en una bolsa plstica y ponga una toalla entre la bolsa y la piel.  Utilice los medicamentos de venta libre o de prescripcin para Chief Technology Officer, Environmental health practitioner o la [place], segn se lo indique el profesional que lo asiste. SOLICITE ATENCIN MDICA DE INMEDIATO SI:  El dolor aumenta o siente muchas molestias.  Tiene fiebre.  Presenta dificultades respiratorias.  Tose y escupe sangre.  Siente falta de aire o dolor en el pecho, sudoracin o vmitos.  Desarrolla nuevos e inexplicables sntomas (problemas). EST SEGURO QUE:   Comprende las instrucciones para el alta mdica.  Controlar su enfermedad.  Solicitar atencin mdica de inmediato segn las indicaciones. Document Released: 08/07/2005 Document Revised: 01/20/2012 [facility] Patient Information 2014 [place], [place].  Cyclobenzaprine tablets Qu es este medicamento? La CICLOBENZAPRINA es un relajante muscular. Se utiliza para tratar [employer] y la rigidez de los msculos y espasmos  musculares. Este medicamento puede ser utilizado para otros usos; si tiene alguna pregunta consulte con su proveedor de atencin mdica o con su farmacutico. MARCAS COMERCIALES DISPONIBLES: Fexmid, Flexeril Qu le debo informar a mi profesional de la salud antes de tomar este medicamento? Necesita saber si usted presenta alguno de los siguientes problemas o situaciones: -enfermedad cardiaca, pulso cardiaco irregular o ataque cardiaco previo -enfermedad heptica -problemas tiroideos -una reaccin alrgica o inusual a la ciclobenzaprina, antidepresivos tricclicos, lactosa, a otros medicamentos, alimentos, colorantes o conservadores -si est embarazada o buscando quedar embarazada -si est amamantando a un beb Cmo debo utilizar este medicamento? Tome este medicamento por va oral con un vaso de agua. Siga las instrucciones de la etiqueta del [place]. Si este Social worker, tmelo con alimentos o con [employer]. No tome su medicamento con una frecuencia mayor a la indicada. Hable con su pediatra para informarse acerca del uso de este medicamento en nios. Puede requerir atencin especial. Sobredosis: Pngase en contacto inmediatamente con un centro toxicolgico o una sala de urgencia si usted cree que haya tomado demasiado medicamento. ATENCIN: [employer] es solo para usted. No comparta este medicamento con nadie. Qu sucede si me olvido de una dosis? Si olvida una dosis, tmela lo antes posible. Si es casi la hora de su dosis siguiente, tome slo esa dosis. No tome dosis adicionales o dobles. Qu puede interactuar con este medicamento? No tome esta medicina con ninguno de los siguientes medicamentos: -cisapride -droperidol -flecainida -grepafloxacino -halofantrina -levometadilo -IMAOs tales como Carbex, Eldepryl, Marplan, Nardil y [place] -nilotinib -pimozida -probucol -sertindol Esta medicina tambin puede interactuar con los siguientes  medicamentos: -abarlix -alcohol -colorantes de contraste -dolasetrn -guanetidina -medicamentos para el cncer -medicamentos para la depresin, ansiedad o trastornos psicticos -medicamentos  para tratar el pulso cardiaco irregular -medicamentos usados para hacerle dormir o para entumecer durante una ciruga o procedimiento -metadona -octreotida -ondansetrn -palonosetrn -fenotiazinas, tales como clorpromacina, mesoridazina, proclorperazina, tioridazina -algunos medicamentos para infecciones, tales como alfuzosn, cloroquina, claritromicina, levofloxacino, mefloquina, pentamidina, troleandomicina -tramadol -vardenafil Puede ser que esta lista no menciona todas las posibles interacciones. Informe a su profesional de [employer] de [employer] productos a base de hierbas, medicamentos de [place] o suplementos nutritivos que est tomando. Si usted fuma, consume bebidas alcohlicas o si utiliza drogas ilegales, indqueselo tambin a su profesional de [employer]. Algunas sustancias pueden interactuar con su medicamento. A qu debo estar atento al usar [employer]? Consulte a su mdico o a su profesional de la salud si su problema no mejora en 1 a 3 semanas. Puede experimentar somnolencia o mareos al comenzar con el medicamento o al cambiar de dosis. No conduzca ni utilice maquinaria, ni haga nada que sea peligroso hasta que sepa cmo le afecta este medicamento. Sintese y pngase de pie lentamente. Se le podr secar la boca. Masticar chicle sin azcar, chupar caramelos duros y beber agua le ayudarn a Pharmacologist la boca hmeda. Qu efectos secundarios puedo tener al [employer] este medicamento? Efectos secundarios que debe informar a su mdico o a Producer, television/film/video de la salud tan pronto como sea posible: -Therapist, art como erupcin cutnea, picazn o urticarias, hinchazn de la cara, labios o lengua -dolor en el pecho -pulso cardiaco  rpido -alucinaciones -convulsiones -vmito Efectos secundarios que, por lo general, no requieren atencin mdica (debe informarlos a su mdico o a su profesional de la salud si persisten o si son molestos): -dolor de cabeza Puede ser que esta lista no menciona todos los posibles efectos secundarios. Comunquese a su mdico por asesoramiento mdico [employer]. Usted puede informar los efectos secundarios a la FDA por telfono al 1-800-FDA-1088. Dnde debo guardar mi medicina? Mantngala fuera del alcance de los nios. Gurdela a [employer], entre 15 y 30 grados C (51 y 62 grados F). Mantenga el envase bien cerrado. Deseche todo el medicamento que no haya utilizado, despus de la fecha de vencimiento. ATENCIN: Este folleto es un resumen. Puede ser que no cubra toda la posible informacin. Si usted tiene preguntas acerca de esta medicina, consulte con su mdico, su farmacutico o su profesional de Radiographer, therapeutic.  2014, Elsevier/Gold Standard. (2007-09-08 14:04:00)

## 2013-10-29 NOTE — Telephone Encounter (Signed)
Orders placed breast center will contact pt to schedule.  

## 2013-11-12 ENCOUNTER — Other Ambulatory Visit: Payer: Self-pay

## 2013-11-15 NOTE — Telephone Encounter (Signed)
Spoke with Baird Lyonsasey at breast center regarding scheduling the below, they will be contacting patient to schedule.

## 2013-11-18 NOTE — Telephone Encounter (Signed)
Appt 09/12/14 

## 2013-11-26 ENCOUNTER — Encounter: Payer: BC Managed Care – PPO | Admitting: Gynecology

## 2014-01-12 ENCOUNTER — Ambulatory Visit: Payer: BC Managed Care – PPO

## 2014-01-26 ENCOUNTER — Ambulatory Visit
Admission: RE | Admit: 2014-01-26 | Discharge: 2014-01-26 | Disposition: A | Discharge status: Home / Self Care | Payer: BC Managed Care – PPO | Source: Ambulatory Visit

## 2014-01-26 DIAGNOSIS — Z1231 Encounter for screening mammogram for malignant neoplasm of breast: Secondary | ICD-10-CM

## 2014-02-06 ENCOUNTER — Encounter (HOSPITAL_COMMUNITY): Payer: Self-pay | Admitting: Registered Nurse

## 2014-02-06 ENCOUNTER — Emergency Department (HOSPITAL_COMMUNITY)
Admission: EM | Admit: 2014-02-06 | Discharge: 2014-02-06 | Disposition: A | Discharge status: Home / Self Care | Payer: BC Managed Care – PPO | Attending: Emergency Medicine | Admitting: Emergency Medicine

## 2014-02-06 DIAGNOSIS — N951 Menopausal and female climacteric states: Secondary | ICD-10-CM

## 2014-02-06 DIAGNOSIS — Z3202 Encounter for pregnancy test, result negative: Secondary | ICD-10-CM | POA: Insufficient documentation

## 2014-02-06 DIAGNOSIS — F329 Major depressive disorder, single episode, unspecified: Secondary | ICD-10-CM

## 2014-02-06 DIAGNOSIS — T50901A Poisoning by unspecified drugs, medicaments and biological substances, accidental (unintentional), initial encounter: Secondary | ICD-10-CM

## 2014-02-06 DIAGNOSIS — F3289 Other specified depressive episodes: Secondary | ICD-10-CM

## 2014-02-06 DIAGNOSIS — E288 Other ovarian dysfunction: Secondary | ICD-10-CM

## 2014-02-06 DIAGNOSIS — T385X1A Poisoning by other estrogens and progestogens, accidental (unintentional), initial encounter: Secondary | ICD-10-CM | POA: Insufficient documentation

## 2014-02-06 DIAGNOSIS — T50992A Poisoning by other drugs, medicaments and biological substances, intentional self-harm, initial encounter: Secondary | ICD-10-CM | POA: Insufficient documentation

## 2014-02-06 DIAGNOSIS — F29 Unspecified psychosis not due to a substance or known physiological condition: Secondary | ICD-10-CM | POA: Insufficient documentation

## 2014-02-06 DIAGNOSIS — Z8742 Personal history of other diseases of the female genital tract: Secondary | ICD-10-CM | POA: Insufficient documentation

## 2014-02-06 DIAGNOSIS — N83202 Unspecified ovarian cyst, left side: Secondary | ICD-10-CM

## 2014-02-06 DIAGNOSIS — Z79899 Other long term (current) drug therapy: Secondary | ICD-10-CM | POA: Insufficient documentation

## 2014-02-06 DIAGNOSIS — F411 Generalized anxiety disorder: Secondary | ICD-10-CM

## 2014-02-06 DIAGNOSIS — Q5122 Other partial doubling of uterus: Secondary | ICD-10-CM

## 2014-02-06 DIAGNOSIS — R6882 Decreased libido: Secondary | ICD-10-CM

## 2014-02-06 DIAGNOSIS — K5900 Constipation, unspecified: Secondary | ICD-10-CM

## 2014-02-06 DIAGNOSIS — F32A Depression, unspecified: Secondary | ICD-10-CM | POA: Diagnosis present

## 2014-02-06 DIAGNOSIS — R232 Flushing: Secondary | ICD-10-CM

## 2014-02-06 DIAGNOSIS — Z872 Personal history of diseases of the skin and subcutaneous tissue: Secondary | ICD-10-CM | POA: Insufficient documentation

## 2014-02-06 DIAGNOSIS — N912 Amenorrhea, unspecified: Secondary | ICD-10-CM

## 2014-02-06 DIAGNOSIS — L811 Chloasma: Secondary | ICD-10-CM

## 2014-02-06 DIAGNOSIS — R45851 Suicidal ideations: Secondary | ICD-10-CM

## 2014-02-06 DIAGNOSIS — IMO0002 Reserved for concepts with insufficient information to code with codable children: Secondary | ICD-10-CM | POA: Insufficient documentation

## 2014-02-06 DIAGNOSIS — E2839 Other primary ovarian failure: Secondary | ICD-10-CM

## 2014-02-06 LAB — URINALYSIS, ROUTINE W REFLEX MICROSCOPIC
Bilirubin Urine: NEGATIVE
Glucose, UA: NEGATIVE mg/dL
Hgb urine dipstick: NEGATIVE
Ketones, ur: NEGATIVE mg/dL
LEUKOCYTES UA: NEGATIVE
NITRITE: NEGATIVE
PROTEIN: NEGATIVE mg/dL
Specific Gravity, Urine: 1.011 (ref 1.005–1.030)
UROBILINOGEN UA: 0.2 mg/dL (ref 0.0–1.0)
pH: 6.5 (ref 5.0–8.0)

## 2014-02-06 LAB — COMPREHENSIVE METABOLIC PANEL
ALT: 10 U/L (ref 0–35)
AST: 18 U/L (ref 0–37)
Albumin: 4 g/dL (ref 3.5–5.2)
Alkaline Phosphatase: 104 U/L (ref 39–117)
BUN: 14 mg/dL (ref 6–23)
CO2: 24 meq/L (ref 19–32)
Calcium: 9.8 mg/dL (ref 8.4–10.5)
Chloride: 102 mEq/L (ref 96–112)
Creatinine, Ser: 0.59 mg/dL (ref 0.50–1.10)
GLUCOSE: 108 mg/dL — AB (ref 70–99)
Potassium: 4.1 mEq/L (ref 3.7–5.3)
Sodium: 139 mEq/L (ref 137–147)
Total Bilirubin: 0.3 mg/dL (ref 0.3–1.2)
Total Protein: 7.4 g/dL (ref 6.0–8.3)

## 2014-02-06 LAB — RAPID URINE DRUG SCREEN, HOSP PERFORMED
Amphetamines: NOT DETECTED
Barbiturates: NOT DETECTED
Benzodiazepines: NOT DETECTED
Cocaine: NOT DETECTED
OPIATES: NOT DETECTED
TETRAHYDROCANNABINOL: NOT DETECTED

## 2014-02-06 LAB — CBC WITH DIFFERENTIAL/PLATELET
Basophils Absolute: 0 10*3/uL (ref 0.0–0.1)
Basophils Relative: 0 % (ref 0–1)
EOS PCT: 1 % (ref 0–5)
Eosinophils Absolute: 0.1 10*3/uL (ref 0.0–0.7)
HCT: 38.5 % (ref 36.0–46.0)
Hemoglobin: 13 g/dL (ref 12.0–15.0)
LYMPHS ABS: 2.1 10*3/uL (ref 0.7–4.0)
LYMPHS PCT: 30 % (ref 12–46)
MCH: 29.8 pg (ref 26.0–34.0)
MCHC: 33.8 g/dL (ref 30.0–36.0)
MCV: 88.3 fL (ref 78.0–100.0)
Monocytes Absolute: 0.5 10*3/uL (ref 0.1–1.0)
Monocytes Relative: 8 % (ref 3–12)
Neutro Abs: 4.3 10*3/uL (ref 1.7–7.7)
Neutrophils Relative %: 61 % (ref 43–77)
Platelets: 308 10*3/uL (ref 150–400)
RBC: 4.36 MIL/uL (ref 3.87–5.11)
RDW: 12.5 % (ref 11.5–15.5)
WBC: 7 10*3/uL (ref 4.0–10.5)

## 2014-02-06 LAB — TROPONIN I

## 2014-02-06 LAB — POC URINE PREG, ED: Preg Test, Ur: NEGATIVE

## 2014-02-06 LAB — SALICYLATE LEVEL

## 2014-02-06 LAB — ACETAMINOPHEN LEVEL: Acetaminophen (Tylenol), Serum: <15 ug/mL (ref 10–30)

## 2014-02-06 MED ORDER — SODIUM CHLORIDE 0.9 % IV BOLUS (SEPSIS)
1000.0000 mL | Freq: Once | INTRAVENOUS | Status: AC
  Administered 2014-02-06: 1000 mL via INTRAVENOUS

## 2014-02-06 MED ORDER — NICOTINE 21 MG/24HR TD PT24: 21.0000 mg | MEDICATED_PATCH | Freq: Every day | TRANSDERMAL | Status: DC

## 2014-02-06 MED ORDER — ONDANSETRON HCL 4 MG PO TABS: 4.0000 mg | ORAL_TABLET | Freq: Three times a day (TID) | ORAL | Status: DC | PRN

## 2014-02-06 MED ORDER — ACETAMINOPHEN 325 MG PO TABS: 650.0000 mg | ORAL_TABLET | ORAL | Status: DC | PRN

## 2014-02-06 NOTE — ED Notes (Signed)
Pt ate 75% of her lunch.

## 2014-02-06 NOTE — ED Provider Notes (Signed)
CSN: [id number]     Arrival date & time 02/06/14  0047 History   First MD Initiated Contact with Patient 02/06/14 0051     Chief Complaint  Patient presents with  . Ingestion     (Consider location/radiation/quality/duration/timing/severity/associated sxs/prior Treatment) HPI Comments: Pt comes in with cc of overdose. Pt has hx of depression, no hx of SI. Per EMS, and later family, patient was found laying unresponsive with empty bottle of medroxyprogrsterone. Pt admits to taking medroxy, and 4 ibuprofen. She admits to taking the meds in an attempt to kill herself. She denies any narcotic pain meds, substance abuse, and family confirms that statement as well. Apparently, there is increased stress in patient's life, as she is trying ti support her daughter with her new grand kid.  The history is provided by the patient.    Past Medical History  Diagnosis Date  . Premature ovarian failure   . Chloasma    Past Surgical History  Procedure Laterality Date  . Tubal ligation    . Cesarean section      x4   Family History  Problem Relation Age of Onset  . Diabetes Maternal Aunt    History  Substance Use Topics  . Smoking status: Never Smoker   . Smokeless tobacco: Never Used  . Alcohol Use: No   OB History   Grav Para Term Preterm Abortions TAB SAB Ect Mult Living   4 4 3 1      4      Review of Systems  Constitutional: Positive for activity change. Negative for fever.  Respiratory: Negative for shortness of breath.   Cardiovascular: Negative for chest pain.  Gastrointestinal: Negative for nausea, vomiting and abdominal pain.  Genitourinary: Negative for dysuria.  Musculoskeletal: Negative for neck pain.  Neurological: Negative for headaches.  Psychiatric/Behavioral: Positive for suicidal ideas, confusion and self-injury. Negative for hallucinations.      Allergies  Review of patient's allergies indicates no known allergies.  Home Medications   Current Outpatient Rx   Name  Route  Sig  Dispense  Refill  . cyclobenzaprine (FLEXERIL) 10 MG tablet      Take one at bedtime for 7-10 days   20 tablet   0   . Estradiol 0.52 MG/0.87 GM (0.06%) GEL   Topical   Apply 1 application topically daily. Apply to one arm daily only   1 Bottle   11   . ibuprofen (ADVIL,MOTRIN) 800 MG tablet   Oral   Take 1 tablet (800 mg total) by mouth every 8 (eight) hours as needed.   30 tablet   0   . medroxyPROGESTERone (PROVERA) 10 MG tablet      Take one tablet first 10 days of the month   30 tablet   4   . Multiple Vitamins-Minerals (MULTIVITAMIN WITH MINERALS) tablet   Oral   Take 1 tablet by mouth daily.          . predniSONE (STERAPRED UNI-PAK) 10 MG tablet   Oral   Take by mouth daily. 10 Mg Steroid Pak for 6 days #21   21 tablet   0    BP 165/91  Pulse 93  Temp(Src) 97.6 F (36.4 C) (Oral)  Resp 12  SpO2 100% Physical Exam  Nursing note and vitals reviewed. Constitutional: She is oriented to person, place, and time. She appears well-developed and well-nourished.  HENT:  Head: Normocephalic and atraumatic.  Eyes: EOM are normal. Pupils are equal, round, and reactive to light.  Neck: Neck supple.  Cardiovascular: Normal rate, regular rhythm and normal heart sounds.   No murmur heard. Pulmonary/Chest: Effort normal. No respiratory distress.  Abdominal: Soft. She exhibits no distension. There is no tenderness. There is no rebound and no guarding.  Neurological: She is alert and oriented to person, place, and time.  Skin: Skin is warm and dry.  Psychiatric: Judgment and thought content normal.  Depressed mood and affect    ED Course  Procedures (including critical care time) Labs Review Labs Reviewed  COMPREHENSIVE METABOLIC PANEL - Abnormal; Notable for the following:    Glucose, Bld 108 (*)    All other components within normal limits  CBC WITH DIFFERENTIAL  TROPONIN I  URINALYSIS, ROUTINE W REFLEX MICROSCOPIC  URINE RAPID DRUG  SCREEN (HOSP PERFORMED)  POC URINE PREG, ED   Imaging Review No results found.   EKG Interpretation None      MDM   Final diagnoses:  None    Pt comes in with cc of overdose. She appears depressed. Was nor responding to questions at arrival - however, within few minutes started talking, and then broke down into tears, and admitted to medroxy and motrin OD to kill herself.  She took 4 motrins and medroxy x 7 - neither of those will lead to any poor medical outcome. Salicylates level is normal.  Psych assessed and patient will get full psych assessment in the AM. She has been IVCd for now, as she wanted to leave. Pt and family understands the importance of full psych workup.  Derwood KaplanAnkit Marcelo Ickes, MD 02/06/14 [phone]
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
## 2014-02-06 NOTE — ED Notes (Signed)
Bed: [id number] Expected date:  Expected time:  Means of arrival:  Comments: TCU - bed

## 2014-02-06 NOTE — ED Notes (Signed)
Bed: RESB Expected date:  Expected time:  Means of arrival:  Comments: EMS overdose 

## 2014-02-06 NOTE — ED Notes (Signed)
Patient placed in scrubs, belongings secured at nurses station at this time. Security requested to bedside to wand patient and search belongings.

## 2014-02-06 NOTE — Consult Note (Signed)
  Contacted by TTS-Pt admits to suicide attempt by OD but refuses hospitalization.Needs MD Suicide risk assessment .Observe overnite and assess in am for ? admisssion

## 2014-02-06 NOTE — ED Notes (Signed)
Dr. Shela CommonsJ and team in with pt

## 2014-02-06 NOTE — ED Notes (Signed)
Patient ate 50% of her meal at this time. Pt safe on the unit in her bed calm friend visiting at this time.

## 2014-02-06 NOTE — ED Notes (Signed)
Pt dressed and waiting for ride, understands discharge instructions.
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
## 2014-02-06 NOTE — ED Notes (Signed)
Bed: WA15 Expected date:  Expected time:  Means of arrival:  Comments: ResB 

## 2014-02-06 NOTE — ED Notes (Signed)
Patient belongings are in locker 4526

## 2014-02-06 NOTE — ED Notes (Signed)
Pt is now IVC'd  

## 2014-02-06 NOTE — ED Notes (Signed)
[facility]ide has arrived, pt ambulatory out of department

## 2014-02-06 NOTE — BH Assessment (Signed)
Received call for assessment. Spoke to Kelsey KaplanAnkit Nanavati, MD who said Pt overdosed on medication and family called 911. Pt reports feeling depressed and suicidal due to family problems.  Harlin RainFord Ellis Ria CommentWarrick Jr, LPC, [facility] Triage Specialist

## 2014-02-06 NOTE — ED Notes (Signed)
Per EMS, patient from home, family called 911 for overdose. Patient was found with pill bottle of "Medroxyrr AC", 1 pill left in the bottle, dispensed 10 on 2/27, directions state to take 1 pill daily 1st ten days of month. Patient reports to Dr Rhunette CroftNanavati she also took 4 ibuprofen.

## 2014-02-06 NOTE — Discharge Instructions (Signed)
Depression, Adult °Depression is feeling sad, low, down in the dumps, blue, gloomy, or empty. In general, there are two kinds of depression: °· Normal sadness or grief. This can happen after something upsetting. It often goes away on its own within 2 weeks. After losing a loved one (bereavement), normal sadness and grief may last longer than two weeks. It usually gets better with time. °· Clinical depression. This kind lasts longer than normal sadness or grief. It keeps you from doing the things you normally do in life. It is often hard to function at home, work, or at school. It may affect your relationships with others. Treatment is often needed. °GET HELP RIGHT AWAY IF: °· You have thoughts about hurting yourself or others. °· You lose touch with reality (psychotic symptoms). You may: °· See or hear things that are not real. °· Have untrue beliefs about your life or people around you. °· Your medicine is giving you problems. °MAKE SURE YOU: °· Understand these instructions. °· Will watch your condition. °· Will get help right away if you are not doing well or get worse. °Document Released: 11/30/2010 Document Revised: 07/22/2012 Document Reviewed: 02/27/2012 °ExitCare® Patient Information ©2014 [employer]. ° °

## 2014-04-29 ENCOUNTER — Other Ambulatory Visit (HOSPITAL_COMMUNITY)
Admission: RE | Admit: 2014-04-29 | Discharge: 2014-04-29 | Disposition: A | Discharge status: Home / Self Care | Payer: BC Managed Care – PPO | Source: Ambulatory Visit | Attending: Gynecology | Admitting: Gynecology

## 2014-04-29 ENCOUNTER — Encounter: Payer: Self-pay | Admitting: Gynecology

## 2014-04-29 ENCOUNTER — Ambulatory Visit (INDEPENDENT_AMBULATORY_CARE_PROVIDER_SITE_OTHER): Payer: BC Managed Care – PPO | Admitting: Gynecology

## 2014-04-29 VITALS — BP 110/68 | Ht 59.5 in | Wt 110.0 lb

## 2014-04-29 DIAGNOSIS — N83202 Unspecified ovarian cyst, left side: Secondary | ICD-10-CM

## 2014-04-29 DIAGNOSIS — Z1151 Encounter for screening for human papillomavirus (HPV): Secondary | ICD-10-CM | POA: Insufficient documentation

## 2014-04-29 DIAGNOSIS — F4323 Adjustment disorder with mixed anxiety and depressed mood: Secondary | ICD-10-CM

## 2014-04-29 DIAGNOSIS — E2839 Other primary ovarian failure: Secondary | ICD-10-CM

## 2014-04-29 DIAGNOSIS — N83209 Unspecified ovarian cyst, unspecified side: Secondary | ICD-10-CM

## 2014-04-29 DIAGNOSIS — Z01419 Encounter for gynecological examination (general) (routine) without abnormal findings: Secondary | ICD-10-CM

## 2014-04-29 DIAGNOSIS — E288 Other ovarian dysfunction: Secondary | ICD-10-CM

## 2014-04-29 DIAGNOSIS — M2550 Pain in unspecified joint: Secondary | ICD-10-CM

## 2014-04-29 LAB — CBC WITH DIFFERENTIAL/PLATELET
BASOS ABS: 0 10*3/uL (ref 0.0–0.1)
Basophils Relative: 0 % (ref 0–1)
Eosinophils Absolute: 0.1 10*3/uL (ref 0.0–0.7)
Eosinophils Relative: 1 % (ref 0–5)
HCT: 38.4 % (ref 36.0–46.0)
HEMOGLOBIN: 13.3 g/dL (ref 12.0–15.0)
Lymphocytes Relative: 32 % (ref 12–46)
Lymphs Abs: 2.2 10*3/uL (ref 0.7–4.0)
MCH: 30.4 pg (ref 26.0–34.0)
MCHC: 34.6 g/dL (ref 30.0–36.0)
MCV: 87.7 fL (ref 78.0–100.0)
MONOS PCT: 6 % (ref 3–12)
Monocytes Absolute: 0.4 10*3/uL (ref 0.1–1.0)
NEUTROS ABS: 4.1 10*3/uL (ref 1.7–7.7)
NEUTROS PCT: 61 % (ref 43–77)
Platelets: 329 10*3/uL (ref 150–400)
RBC: 4.38 MIL/uL (ref 3.87–5.11)
RDW: 14.1 % (ref 11.5–15.5)
WBC: 6.8 10*3/uL (ref 4.0–10.5)

## 2014-04-29 LAB — COMPREHENSIVE METABOLIC PANEL
ALBUMIN: 4.1 g/dL (ref 3.5–5.2)
ALK PHOS: 84 U/L (ref 39–117)
ALT: 11 U/L (ref 0–35)
AST: 14 U/L (ref 0–37)
BUN: 12 mg/dL (ref 6–23)
CO2: 29 mEq/L (ref 19–32)
Calcium: 9.7 mg/dL (ref 8.4–10.5)
Chloride: 106 mEq/L (ref 96–112)
Creat: 0.64 mg/dL (ref 0.50–1.10)
GLUCOSE: 84 mg/dL (ref 70–99)
POTASSIUM: 4.4 meq/L (ref 3.5–5.3)
SODIUM: 141 meq/L (ref 135–145)
TOTAL PROTEIN: 6.8 g/dL (ref 6.0–8.3)
Total Bilirubin: 0.5 mg/dL (ref 0.2–1.2)

## 2014-04-29 LAB — LIPID PANEL
Cholesterol: 170 mg/dL (ref 0–200)
HDL: 48 mg/dL (ref >39)
LDL Cholesterol: 104 mg/dL — ABNORMAL HIGH (ref 0–99)
Total CHOL/HDL Ratio: 3.5 Ratio
Triglycerides: 89 mg/dL (ref <150)
VLDL: 18 mg/dL (ref 0–40)

## 2014-04-29 LAB — TSH: TSH: 0.806 u[IU]/mL (ref 0.350–4.500)

## 2014-04-29 MED ORDER — ESTRADIOL 2 MG PO TABS: 2.0000 mg | ORAL_TABLET | Freq: Every day | ORAL | Status: DC

## 2014-04-29 MED ORDER — ALPRAZOLAM 0.25 MG PO TABS: 0.2500 mg | ORAL_TABLET | Freq: Every evening | ORAL | Status: DC | PRN

## 2014-04-29 MED ORDER — MEDROXYPROGESTERONE ACETATE 10 MG PO TABS: ORAL_TABLET | ORAL | Status: DC

## 2014-04-29 NOTE — Progress Notes (Signed)
Kelsey Bowen 11-17-1970 [id number]   History:    43 y.o.  for annual exam who is complaining of persistent hot flashes, irritability, mood swings, decreased libido. Patient was diagnosed with premature ovarian failure in 2013. Patient cannot tolerate oral contraceptive pill. In the last year she was started on Elestrin 0.06% transdermal gel with the addition of Provera 10 mg for 10 days of the month. Review of her record [facility] with a value of 63.4 and last year her FSH was found to be elevated at 76.7. Patient also has had a history of a left ovarian cyst that we have been monitoring which appears to be benign in appearance with normal CA 125. Last ultrasound July 2014. Patient with no prior history of abnormal Pap smear. Her mammogram March of this year was normal. She is taking her calcium and vitamin D. She also has uniformly today that she is complaining of joint discomfort as well as photosensitivity and facial pigmentation. She denies any family history of lupus or any collagen vascular disease. Patient has not received her Tdap vaccine yet.  Past medical history,surgical history, family history and social history were all reviewed and documented in the EPIC chart.  Gynecologic History Patient's last menstrual period was 01/27/2014. Contraception: post menopausal status Last Pap: 2012. Results were: normal Last mammogram: 2015. Results were: normal  Obstetric History OB History  Gravida Para Term Preterm AB SAB TAB Ectopic Multiple Living  4 4 3 1      4     # Outcome Date GA Lbr Len/2nd Weight Sex Delivery Anes PTL Lv  4 TRM     F CS  N Y  3 TRM     M CS  N Y  2 TRM     F SVD  N Y  1 PRE     F CS  Y Y       ROS: A ROS was performed and pertinent positives and negatives are included in the history.  GENERAL: No fevers or chills. HEENT: No change in vision, no earache, sore throat or sinus congestion. NECK: No pain or stiffness.  CARDIOVASCULAR: No chest pain or pressure. No palpitations. PULMONARY: No shortness of breath, cough or wheeze. GASTROINTESTINAL: No abdominal pain, nausea, vomiting or diarrhea, melena or bright red blood per rectum. GENITOURINARY: No urinary frequency, urgency, hesitancy or dysuria. MUSCULOSKELETAL: No joint or muscle pain, no back pain, no recent trauma. DERMATOLOGIC: No rash, no itching, no lesions. ENDOCRINE: No polyuria, polydipsia, no heat or cold intolerance. No recent change in weight. HEMATOLOGICAL: No anemia or easy bruising or bleeding. NEUROLOGIC: No headache, seizures, numbness, tingling or weakness. PSYCHIATRIC: No depression, no loss of interest in normal activity or change in sleep pattern.     Exam: chaperone present  BP 110/68  Ht 4' 11.5" (1.511 m)  Wt 110 lb (49.896 kg)  BMI 21.85 kg/m2  LMP 01/27/2014  Body mass index is 21.85 kg/(m^2).  General appearance : Well developed well nourished female. No acute distress HEENT: Neck supple, trachea midline, no carotid bruits, no thyroidmegaly Lungs: Clear to auscultation, no rhonchi or wheezes, or rib retractions  Heart: Regular rate and rhythm, no murmurs or gallops Breast:Examined in sitting and supine position were symmetrical in appearance, no palpable masses or tenderness,  no skin retraction, no nipple inversion, no nipple discharge, no skin discoloration, no axillary or supraclavicular lymphadenopathy Abdomen: no palpable masses or tenderness, no rebound or guarding Extremities:  no edema or skin discoloration or tenderness  Pelvic:  Bartholin, Urethra, Skene Glands: Within normal limits             Vagina: No gross lesions or discharge  Cervix: No gross lesions or discharge  Uterus  anteverted, normal size, shape and consistency, non-tender and mobile  Adnexa  Without masses or tenderness  Anus and perineum  normal   Rectovaginal  normal sphincter tone without palpated masses or tenderness             Hemoccult not  indicated     Assessment/Plan:  43 y.o. female for annual exam with history of premature ovarian failure. We're going to change her HRT regimen. We are going to start her on Estrace 2 mg one by mouth daily with the addition of Provera 10 mg for 10 days of the month to see this will help with her menopausal symptoms. Because of her complains now of arthralgias along with photosensitivity and facial pigmentation we are going to check an ANA to rule out collagen vascular disorder. The following labs were also ordered: CBC, fasting lipid profile, comprehensive metabolic panel, TSH and urinalysis. I'm going to give her the name of one of my internal medicine colleagues for her to followup as well. Patient was reminded of the importance of calcium vitamin D and regular exercise for osteoporosis prevention. Next she we will do a bone density study. She will return back to the office in 1-2 weeks for followup on left ovarian cyst. For her anxiety she is going to be prescribed Xanax 0.25 mg to take one by mouth daily when necessary. She was reminded also on the importance of monthly self breast exams.  Note: This dictation was prepared with  Dragon/digital dictation along withSmart phrase technology. Any transcriptional errors that result from this process are unintentional.   Ok EdwardsFERNANDEZ,JUAN H MD, 12:11 PM 04/29/2014

## 2014-04-29 NOTE — Addendum Note (Signed)
Addended by: Berna SpareASTILLO, BLANCA A on: 04/29/2014 12:41 PM   Modules accepted: Orders

## 2014-04-29 NOTE — Patient Instructions (Signed)
Vacuna difteria, ttanos, tos ferina (DTP) - Lo que debe saber  (Tetanus, Diphtheria, Pertussis [Tdap] Vaccine, What You Need to Know) PORQU VACUNARSE?  El ttanos, la difteria y la tos ferina pueden ser enfermedades muy graves, an en adolescentes y adultos. La vacuna Tdap nos puede proteger de estas enfermedades.  El TTANOS (Trismo) provoca la contraccin dolorosa de los msculos, por lo general, en todo el cuerpo.   Puede causar el endurecimiento de los msculos de la cabeza y el cuello, de modo que impide abrir la boca, tragar y en algunos casos, respirar. El ttanos causa la muerte de 1 de cada 5 personas que se infectan. La DIFTERIA produce la formacin de una membrana gruesa que cubre el fondo de la garganta.   Puede causar problemas respiratorios, parlisis, insuficiencia cardaca e incluso la muerte. TOS FERINA (Pertusis) causa episodios de tos graves, que pueden hacer difcil la respiracin, causar vmitos y trastornos del sueo.   Tambin puede ser la causa de prdida de peso, incontinencia y fractura de costillas. Dos de cada 100 adolescentes y cinco de cada 100 adultos que enferman de pertusis deben ser hospitalizados, tienen complicaciones como la neumona o mueren. Estas enfermedades son provocadas por bacterias. La difteria y el pertusis se contagian de persona a persona a travs de la tos o el estornudo. El ttanos ingresa al organismo a travs de cortes, rasguos o heridas.  Antes de las vacunas, en los [place] se vieron ms de 200.000 casos al ao de difteria y tos ferina y cientos de casos de ttanos. Desde el inicio de la vacunacin, los casos de ttanos y difteria han disminuido alrededor del 99% y los casos de tos ferina alrededor del 80%.  Tdap  La vacuna Tdap protege a adolescentes y adultos contra el ttanos, la difteria y la tos ferina. Una dosis de Tdap se administra a los 11 o 12 aos de edad. Las personas que no recibieron la vacuna Tdap a esa edad deben  recibirla tan pronto como sea posible.  Es muy importante que los profesionales de la salud y todos aquellos que tengan contacto cercano con bebs menores de 12 meses reciban la Tdap.  Las mujeres embarazadas deben recibir una dosis de Tdap en cada embarazo, para proteger al recin nacido de la tos ferina. Los nios tienen mayor riesgo de complicaciones graves y potencialmente mortales debido a la tos ferina.  Una vacuna similar, llamada Td, protege contra el ttanos y la difteria, pero no contra la tos ferina. Cada 10 aos debe recibirse un refuerzo de Td. La Tdap se puede administrar como uno de estos refuerzos, si todava no ha recibido una dosis. Tambin se puede aplicar despus de un corte o quemadura grave para prevenir la infeccin por ttanos.  El mdico le dar ms informacin.  La Tdap puede administrarse de manera segura simultneamente con otras vacunas.  ALGUNAS PERSONAS NO DEBEN RECIBIR ESTA VACUNA.   Si alguna vez tuvo una reaccin alrgica potencialmente mortal despus de una dosis de la vacuna contra el ttanos, la diferia o la tos ferina, o tuvo una alergia grave a cualquiera de los componentes de esta vacuna, no debe aplicarse la vacuna. Informe a su mdico si usted sufre algn tipo de alergia grave.  Si estuvo en coma o sufri mltiples convulsiones dentro de los 7 das posteriores despus de una dosis de DTP o DTaP no debe recibir la Tdap, salvo que se encuentre otra causa En este caso puede recibir la Td.  Consulte con   su mdico si:  tiene epilepsia u otra enfermedad del sistema nervioso,  siente dolor intenso o se hincha despus de recibir cualquier vacuna contra la difteria, el ttanos o la tos ferina,  alguna vez ha sufrido el sndrome de Guillain-Barr,  no se siente bien el da en que se ha programado la vacuna. RIESGOS DE UNA REACCIN A LA VACUNA Con cualquier medicamento, incluyendo las vacunas, existe la posibilidad de que aparezcan efectos secundarios. Estos son  leves y desaparecen por s solos, pero tambin son posibles las reacciones graves.  Breves episodios de desmayo pueden seguir a una vacunacin, causando lesiones por la cada. Sentarse o recostarse durante 15 minutos puede ayudar a evitarlo. Informe al mdico si se siente mareado o aturdido, tiene cambios en la visin o zumbidos en los odos.  Problemas leves luego de la Tdap (no interferirn con las actividades)   Dolor en el sitio de la inyeccin (alrededor de 1 de cada 4 adolescentes o 2 de cada 3 adultos).  Enrojecimiento o hinchazn en el lugar de la inyeccin (1 de cada 5 personas).  Fiebre leve de al menos 100,4 F (38 C) (hasta alrededor de 1 cada 25 adolescentes y 1 de cada 100 adultos).  Dolor de cabeza (3 o 4de cada 10 personas).  Cansancio (1 de cada 3 o 4 personas).  Nuseas, vmitos, diarrea, dolor de estmago (1 de cada 4 adolescentes o 1 de cada 10 adultos).  Escalofros, dolores corporales, dolor articular, erupciones, inflamacin de las glndulas (poco frecuente). Problemas moderados: (interfieren con las actividades, pero no requieren atencin mdica)   Dolor en el lugar de la inyeccin (1 de cada 5 adolescentes o 1 de cada 100 adultos).  Enrojecimiento o inflamacin (1 de cada 16 adolescentes y 1 de cada 25 adultos).  Fiebre de ms de 102F o 38,9C (1 de cada 100 adolescentes o 1 de cada 250 adultos).  Dolor de cabeza (alrededor de 4 de cada 20 adolescentes y 3 de cada 10 adultos).  Nuseas, vmitos, diarrea, dolor de estmago (1 a 3 de cada 100 personas).  Hinchazn de todo el brazo en el que se aplic la vacuna (3 de cada100 personas). Problemas graves: luego de la Tdap (no puede realizar las actividades habituales, requiere atencin mdica)   Inflamacin, dolor intenso, sangrado y enrojecimiento en el brazo, en el sitio de la inyeccin (poco frecuente). Una reaccin alrgica grave puede ocurrir despus de la administracin de cualquier vacuna (se estima en  menos de 1 en un milln de dosis).  QU PASA SI HAY UNA REACCIN GRAVE?  Qu signos debo buscar?  Observe todo lo que le preocupe, como signos de una reaccin alrgica grave, fiebre muy alta o cambios en el comportamiento. Los signos de una reaccin alrgica grave pueden incluir urticaria, hinchazn de la cara y la garganta, dificultad para respirar, ritmo cardaco acelerado, mareos y debilidad. Estos sntomas pueden comenzar entre unos pocos minutos y algunas horas despus de la vacunacin.  Qu debo hacer?  Si usted piensa que se trata de una reaccin alrgica grave o de otra emergencia que no puede esperar, llame al 911 o lleve a la persona al hospital ms cercano. De lo contrario, llame a su mdico.  Despus, la reaccin debe informarse a la "Vaccine Adverse Event Reporting System" (Sistema de informacin sobre efectos adversos de las vacunas -VAERS). El mdico o usted mismo pueden realizar el informe en el sitio web del VAERS www.vaers.hhs.govo llame al 1-800-822-7967. El VAERS es slo para informar reacciones. No   brindan consejo mdico.  PROGRAMA NACIONAL DE COMPENSACIN DE DAOS POR VACUNAS  El National Vaccine Injury Compensation Program (VICP) es un programa federal que fue creado para compensar a las personas que puedan haber sufrido daos al recibir ciertas vacunas.  Aquellas personas que consideren que han sufrido un dao como consecuencia de una vacuna y quieren saber ms acerca del programa y como presentar una denuncia, pueden llamar 1-800-338-2382 o visite el sitio web del VICP en www.hrsa.gov/vaccinecompensation.  CMO PUEDO OBTENER MS INFORMACIN?   Consulte a su mdico.  Comunquese con el servicio de salud de su localidad o su estado.  Comunquese con los Centros para el control y la prevencin de enfermedades (Centers for Disease Control and Prevention , CDC).  llamando al 1-800-232-4636 o visitando el sitio web del CDC en www.cdc.gov/vaccines. CDC Tdap Vaccine VIS  (03/19/12)  Document Released: 10/14/2012 ExitCare Patient Information 2015 [employer]. This information is not intended to replace advice given to you by your health care provider. Make sure you discuss any questions you have with your health care provider.  

## 2014-04-30 LAB — URINALYSIS W MICROSCOPIC + REFLEX CULTURE
Bacteria, UA: NONE SEEN
Bilirubin Urine: NEGATIVE
CRYSTALS: NONE SEEN
Casts: NONE SEEN
Glucose, UA: NEGATIVE mg/dL
Hgb urine dipstick: NEGATIVE
Ketones, ur: NEGATIVE mg/dL
Leukocytes, UA: NEGATIVE
NITRITE: NEGATIVE
Protein, ur: NEGATIVE mg/dL
SPECIFIC GRAVITY, URINE: 1.016 (ref 1.005–1.030)
UROBILINOGEN UA: 0.2 mg/dL (ref 0.0–1.0)
pH: 8 (ref 5.0–8.0)

## 2014-05-02 LAB — ANA: Anti Nuclear Antibody(ANA): NEGATIVE

## 2014-05-03 LAB — CYTOLOGY - PAP

## 2014-06-24 ENCOUNTER — Ambulatory Visit: Payer: Self-pay | Admitting: Internal Medicine

## 2014-07-28 ENCOUNTER — Ambulatory Visit: Payer: BC Managed Care – PPO | Admitting: Gynecology

## 2014-07-28 ENCOUNTER — Other Ambulatory Visit: Payer: BC Managed Care – PPO

## 2014-08-10 ENCOUNTER — Other Ambulatory Visit: Payer: Self-pay | Admitting: Gynecology

## 2014-08-10 ENCOUNTER — Encounter: Payer: Self-pay | Admitting: Gynecology

## 2014-08-10 ENCOUNTER — Ambulatory Visit (INDEPENDENT_AMBULATORY_CARE_PROVIDER_SITE_OTHER): Payer: BC Managed Care – PPO

## 2014-08-10 ENCOUNTER — Ambulatory Visit (INDEPENDENT_AMBULATORY_CARE_PROVIDER_SITE_OTHER): Payer: BC Managed Care – PPO | Admitting: Gynecology

## 2014-08-10 VITALS — BP 108/70

## 2014-08-10 DIAGNOSIS — N83209 Unspecified ovarian cyst, unspecified side: Secondary | ICD-10-CM

## 2014-08-10 DIAGNOSIS — Z23 Encounter for immunization: Secondary | ICD-10-CM

## 2014-08-10 DIAGNOSIS — N83202 Unspecified ovarian cyst, left side: Secondary | ICD-10-CM

## 2014-08-10 DIAGNOSIS — N8300 Follicular cyst of ovary, unspecified side: Secondary | ICD-10-CM

## 2014-08-10 DIAGNOSIS — N858 Other specified noninflammatory disorders of uterus: Secondary | ICD-10-CM

## 2014-08-10 DIAGNOSIS — N859 Noninflammatory disorder of uterus, unspecified: Secondary | ICD-10-CM

## 2014-08-10 NOTE — Addendum Note (Signed)
Addended by: Berna SpareASTILLO, Vicie Cech A on: 08/10/2014 04:07 PM   Modules accepted: Orders

## 2014-08-10 NOTE — Progress Notes (Signed)
   Patient presented to the office today for an ultrasound for followup on the left ovarian cyst.Patient was diagnosed with premature ovarian failure in 2013. Patient cannot tolerate oral contraceptive pill. In the last year she was started on Elestrin 0.06% transdermal gel with the addition of Provera 10 mg for 10 days of the month. Review of [facility] with a value of 63.4 and last year her FSH was found to be elevated at 76.7. Patient also has had a history of a left ovarian cyst that we have been monitoring which appears to be benign in appearance with normal CA 125. Last ultrasound July 2014. Patient had been complaining of constant left lower quadrant discomfort but today was totally asymptomatic. She's doing with Estrace 2 mg one by mouth daily with the addition of Prometrium 10 mg for 10 days of the month she is having no vasomotor symptoms and no irregular bleeding.  Ultrasound today: Uterus measured 8.7 x 6.7 x 4.5 cm with endometrial stripe of 4.8 mm. Fluid collection or echogenic focus was noted as has been seen before at the C-section scar measuring 6 x 9 x 4 mm. Right ovary was atrophic. Left ovary echo free cysts one measuring 2.6 x 2.4 x 2.5 cm and a second one 3.4 x 3.4 x 3.1 cm. There was no fluid in the cul-de-sac. 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 We'll repeat her ultrasound in 6 months if still persistent R. patient has pain before that we'll proceed with laparoscopic surgery. Patient would prefer to wait at the present time. Patient received the flu vaccine today.

## 2014-08-10 NOTE — Patient Instructions (Signed)
Influenza Virus Vaccine injection (Fluarix) Qu es este medicamento? La VACUNA ANTIGRIPAL ayuda a disminuir el riesgo de contraer la influenza, tambin conocida como la gripe. La vacuna solo ayuda a protegerle contra algunas cepas de influenza. Esta vacuna no ayuda a reducir el riesgo de contraer influenza pandmica H1N1. Este medicamento puede ser utilizado para otros usos; si tiene alguna pregunta consulte con su proveedor de atencin mdica o con su farmacutico. MARCAS COMERCIALES DISPONIBLES: Fluarix, Fluzone Qu le debo informar a mi profesional de la salud antes de tomar este medicamento? Necesita saber si usted presenta alguno de los siguientes problemas o situaciones: -trastorno de sangrado como hemofilia -fiebre o infeccin -sndrome de Guillain-Barre u otros problemas neurolgicos -problemas del sistema inmunolgico -infeccin por el virus de la inmunodeficiencia humana (VIH) o SIDA -niveles bajos de plaquetas en la sangre -esclerosis mltiple -una reaccin alrgica o inusual a las vacunas antigripales, a los huevos, protenas de pollo, al ltex, a la gentamicina, a otros medicamentos, alimentos, colorantes o conservantes -si est embarazada o buscando quedar embarazada -si est amamantando a un beb Cmo debo utilizar este medicamento? Esta vacuna se administra mediante inyeccin por va intramuscular. Lo administra un profesional de la salud. Recibir una copia de informacin escrita sobre la vacuna antes de cada vacuna. Asegrese de leer este folleto cada vez cuidadosamente. Este folleto puede cambiar con frecuencia. Hable con su pediatra para informarse acerca del uso de este medicamento en nios. Puede requerir atencin especial. Sobredosis: Pngase en contacto inmediatamente con un centro toxicolgico o una sala de urgencia si usted cree que haya tomado demasiado medicamento. ATENCIN: Este medicamento es solo para usted. No comparta este medicamento con nadie. Qu sucede  si me olvido de una dosis? No se aplica en este caso. Qu puede interactuar con este medicamento? -quimioterapia o radioterapia -medicamentos que suprimen el sistema inmunolgico, tales como etanercept, anakinra, infliximab y adalimumab -medicamentos que tratan o previenen cogulos sanguneos, como warfarina -fenitona -medicamentos esteroideos, como la prednisona o la cortisona -teofilina -vacunas Puede ser que esta lista no menciona todas las posibles interacciones. Informe a su profesional de la salud de todos los productos a base de hierbas, medicamentos de venta libre o suplementos nutritivos que est tomando. Si usted fuma, consume bebidas alcohlicas o si utiliza drogas ilegales, indqueselo tambin a su profesional de la salud. Algunas sustancias pueden interactuar con su medicamento. A qu debo estar atento al usar este medicamento? Informe a su mdico o a su profesional de la salud sobre todos los efectos secundarios que persistan despus de 3 das. Llame a su proveedor de atencin mdica si se presentan sntomas inusuales dentro de las 6 semanas posteriores a la vacunacin. Es posible que todava pueda contraer la gripe, pero la enfermedad no ser tan fuerte como normalmente. No puede contraer la gripe de esta vacuna. La vacuna antigripal no le protege contra resfros u otras enfermedades que pueden causar fiebre. Debe vacunarse cada ao. Qu efectos secundarios puedo tener al utilizar este medicamento? Efectos secundarios que debe informar a su mdico o a su profesional de la salud tan pronto como sea posible: -reacciones alrgicas como erupcin cutnea, picazn o urticarias, hinchazn de la cara, labios o lengua Efectos secundarios que, por lo general, no requieren atencin mdica (debe informarlos a su mdico o a su profesional de la salud si persisten o si son molestos): -fiebre -dolor de cabeza -molestias y dolores musculares -dolor, sensibilidad, enrojecimiento o hinchazn en  el lugar de la inyeccin -cansancio o debilidad Puede ser que esta lista   no menciona todos los posibles efectos secundarios. Comunquese a su mdico por asesoramiento mdico sobre los efectos secundarios. Usted puede informar los efectos secundarios a la FDA por telfono al 1-800-FDA-1088. Dnde debo guardar mi medicina? Esta vacuna se administra solamente en clnicas, farmacias, consultorio mdico u otro consultorio de un profesional de la salud y no necesitar guardarlo en su domicilio. ATENCIN: Este folleto es un resumen. Puede ser que no cubra toda la posible informacin. Si usted tiene preguntas acerca de esta medicina, consulte con su mdico, su farmacutico o su profesional de la salud.  2015, Elsevier/Gold Standard. (2010-05-01 15:31:40)  

## 2014-08-11 LAB — CA 125: CA 125: 9 U/mL (ref <35)

## 2014-09-06 ENCOUNTER — Ambulatory Visit: Payer: BC Managed Care – PPO | Admitting: Internal Medicine

## 2014-09-06 ENCOUNTER — Telehealth: Payer: Self-pay | Admitting: *Deleted

## 2014-09-06 DIAGNOSIS — Z0289 Encounter for other administrative examinations: Secondary | ICD-10-CM

## 2014-09-06 NOTE — Telephone Encounter (Signed)
Pt did not show for appointment 09/06/2014 at 1:30pm to establish care. Per Drue NovelPaz, do not reschedule

## 2014-09-12 ENCOUNTER — Encounter: Payer: Self-pay | Admitting: Gynecology

## 2014-11-17 ENCOUNTER — Other Ambulatory Visit: Payer: Self-pay | Admitting: Gynecology

## 2014-11-18 ENCOUNTER — Encounter: Payer: Self-pay | Admitting: Gynecology

## 2014-11-18 NOTE — Telephone Encounter (Signed)
Called into pharmacy

## 2014-12-01 ENCOUNTER — Emergency Department (HOSPITAL_COMMUNITY): Payer: BLUE CROSS/BLUE SHIELD

## 2014-12-01 ENCOUNTER — Emergency Department (HOSPITAL_COMMUNITY)
Admission: EM | Admit: 2014-12-01 | Discharge: 2014-12-01 | Disposition: A | Discharge status: Home / Self Care | Payer: BLUE CROSS/BLUE SHIELD | Attending: Emergency Medicine | Admitting: Emergency Medicine

## 2014-12-01 ENCOUNTER — Encounter (HOSPITAL_COMMUNITY): Payer: Self-pay

## 2014-12-01 DIAGNOSIS — Z872 Personal history of diseases of the skin and subcutaneous tissue: Secondary | ICD-10-CM | POA: Diagnosis not present

## 2014-12-01 DIAGNOSIS — S0011XA Contusion of right eyelid and periocular area, initial encounter: Secondary | ICD-10-CM | POA: Diagnosis not present

## 2014-12-01 DIAGNOSIS — S0033XA Contusion of nose, initial encounter: Secondary | ICD-10-CM | POA: Insufficient documentation

## 2014-12-01 DIAGNOSIS — Y9389 Activity, other specified: Secondary | ICD-10-CM | POA: Insufficient documentation

## 2014-12-01 DIAGNOSIS — Z8639 Personal history of other endocrine, nutritional and metabolic disease: Secondary | ICD-10-CM | POA: Insufficient documentation

## 2014-12-01 DIAGNOSIS — S0012XA Contusion of left eyelid and periocular area, initial encounter: Secondary | ICD-10-CM | POA: Diagnosis not present

## 2014-12-01 DIAGNOSIS — Y92410 Unspecified street and highway as the place of occurrence of the external cause: Secondary | ICD-10-CM | POA: Insufficient documentation

## 2014-12-01 DIAGNOSIS — Y998 Other external cause status: Secondary | ICD-10-CM | POA: Diagnosis not present

## 2014-12-01 DIAGNOSIS — S3993XA Unspecified injury of pelvis, initial encounter: Secondary | ICD-10-CM | POA: Diagnosis present

## 2014-12-01 DIAGNOSIS — F419 Anxiety disorder, unspecified: Secondary | ICD-10-CM | POA: Insufficient documentation

## 2014-12-01 DIAGNOSIS — S0083XA Contusion of other part of head, initial encounter: Secondary | ICD-10-CM

## 2014-12-01 MED ORDER — IBUPROFEN 800 MG PO TABS: 800.0000 mg | ORAL_TABLET | Freq: Three times a day (TID) | ORAL | Status: DC | PRN

## 2014-12-01 MED ORDER — METHOCARBAMOL 500 MG PO TABS: 500.0000 mg | ORAL_TABLET | Freq: Two times a day (BID) | ORAL | Status: DC
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
## 2014-12-01 NOTE — Discharge Instructions (Signed)
Contusion °A contusion is the result of an injury to the skin and underlying tissues and is usually caused by direct trauma. The injury results in the appearance of a bruise on the skin overlying the injured tissues. Contusions cause rupture and bleeding of the small capillaries and blood vessels and affect function, because the bleeding infiltrates muscles, tendons, nerves, or other soft tissues.  °SYMPTOMS  °· Swelling and often a hard lump in the injured area, either superficial or deep. °· Pain and tenderness over the area of the contusion. °· Feeling of firmness when pressure is exerted over the contusion. °· Discoloration under the skin, beginning with redness and progressing to the characteristic "black and blue" bruise. °CAUSES  °A contusion is typically the result of direct trauma. This is often by a blunt object.  °RISK INCREASES WITH: °· Sports that have a high likelihood of trauma (football, boxing, ice hockey, soccer, field hockey, martial arts, basketball, and baseball). °· Sports that make falling from a height likely (high-jumping, pole-vaulting, skating, or gymnastics). °· Any bleeding disorder (hemophilia) or taking medications that affect clotting (aspirin, nonsteroidal anti-inflammatory medications, or warfarin [Coumadin]). °· Inadequate protection of exposed areas during contact sports. °PREVENTION °· Maintain physical fitness: °¨ Joint and muscle flexibility. °¨ Strength and endurance. °¨ Coordination. °· Wear proper protective equipment. Make sure it fits correctly. °PROGNOSIS  °Contusions typically heal without any complications. Healing time varies with the severity of injury and intake of medications that affect clotting. Contusions usually heal in 1 to 4 weeks. °RELATED COMPLICATIONS  °· Damage to nearby nerves or blood vessels, causing numbness, coldness, or paleness. °· Compartment syndrome. °· Bleeding into the soft tissues that leads to disability. °· Infiltrative-type bleeding,  leading to the calcification and impaired function of the injured muscle (rare). °· Prolonged healing time if usual activities are resumed too soon. °· Infection if the skin over the injury site is broken. °· Fracture of the bone underlying the contusion. °· Stiffness in the joint where the injured muscle crosses. °TREATMENT  °Treatment initially consists of resting the injured area as well as medication and ice to reduce inflammation. The use of a compression bandage may also be helpful in minimizing inflammation. As pain diminishes and movement is tolerated, the joint where the affected muscle crosses should be moved to prevent stiffness and the shortening (contracture) of the joint. Movement of the joint should begin as soon as possible. It is also important to work on maintaining strength within the affected muscles. °Occasionally, extra padding over the area of contusion may be recommended before returning to sports, particularly if re-injury is likely.  °MEDICATION  °· If pain relief is necessary these medications are often recommended: °¨ Nonsteroidal anti-inflammatory medications, such as aspirin and ibuprofen. °¨ Other minor pain relievers, such as acetaminophen, are often recommended. °· Prescription pain relievers may be given by your caregiver. Use only as directed and only as much as you need. °HEAT AND COLD °· Cold treatment (icing) relieves pain and reduces inflammation. Cold treatment should be applied for 10 to 15 minutes every 2 to 3 hours for inflammation and pain and immediately after any activity that aggravates your symptoms. Use ice packs or an ice massage. (To do an ice massage fill a large styrofoam cup with water and freeze. Tear a small amount of foam from the top so ice protrudes. Massage ice firmly over the injured area in a circle about the size of a softball.) °· Heat treatment may be used prior to   performing the stretching and strengthening activities prescribed by your caregiver,  physical therapist, or athletic trainer. Use a heat pack or a warm soak. °SEEK MEDICAL CARE IF:  °· Symptoms get worse or do not improve despite treatment in a few days. °· You have difficulty moving a joint. °· Any extremity becomes extremely painful, numb, pale, or cool (This is an emergency!). °· Medication produces any side effects (bleeding, upset stomach, or allergic reaction). °· Signs of infection (drainage from skin, headache, muscle aches, dizziness, fever, or general ill feeling) occur if skin was broken. °Document Released: 10/28/2005 Document Revised: 01/20/2012 Document Reviewed: 02/09/2009 °ExitCare® Patient Information ©2015 [employer]. This information is not intended to replace advice given to you by your health care provider. Make sure you discuss any questions you have with your health care provider. ° ° °Emergency Department Resource Guide °1) Find a Doctor and Pay Out of Pocket °Although you won't have to find out who is covered by your insurance plan, it is a good idea to ask around and get recommendations. You will then need to call the office and see if the doctor you have chosen will accept you as a new patient and what types of options they offer for patients who are self-pay. Some doctors offer discounts or will set up payment plans for their patients who do not have insurance, but you will need to ask so you aren't surprised when you get to your appointment. ° °2) Contact Your Local Health Department °Not all health departments have doctors that can see patients for sick visits, but many do, so it is worth a call to see if yours does. If you don't know where your local health department is, you can check in your phone book. The CDC also has a tool to help you locate your state's health department, and many state websites also have listings of all of their local health departments. ° °3) Find a Walk-in Clinic °If your illness is not likely to be very severe or complicated, you may want  to try a walk in clinic. These are popping up all over the country in pharmacies, drugstores, and shopping centers. They're usually staffed by nurse practitioners or physician assistants that have been trained to treat common illnesses and complaints. They're usually fairly quick and inexpensive. However, if you have serious medical issues or chronic medical problems, these are probably not your best option. ° °No Primary Care Doctor: °- Call [facility] at  832-8000 - they can help you locate a primary care doctor that  accepts your insurance, provides certain services, etc. °- Physician Referral Service- 1-800-533-3463 ° °Chronic Pain Problems: °Organization         Address  Phone   Notes  °[facility]  (336) 297-2271 Patients need to be referred by their primary care doctor.  ° °Medication Assistance: °Organization         Address  Phone   Notes  °[place] [facility] [address] °[place], [place] [address] (336) 641-8030 --Must be a resident of [place] °-- Must have NO insurance coverage whatsoever (no Medicaid/ Medicare, etc.) °-- The pt. MUST have a primary care doctor that directs their care regularly and follows them in the community °  °[facility]  (866) 331-1348   °[employer]  (888) 892-1162   ° °Agencies that provide inexpensive medical care: °Organization         Address  Phone   Notes  °  [facility]  (336) 832-8035   °[facility]    (336) 832-7272   °[facility] [address] °[place], [place] [address] (336) 832-4777   °[facility] [address], °[place] (336) 271-4999   °[facility]    (336) 373-0678   °[facility]    (336) 272-1050   °[facility] ° [address], [place] Phone:  (336) 832-4444, Fax:  (336) 832-4440 Hours of Operation:  9 am - 6 pm, M-F.  Also accepts Medicaid/Medicare and self-pay.   °[facility] ° [address], [place] Phone: (336) 832-3150, Fax: (336) 832-3151. Hours of Operation:  8:30 am - 5:30 pm, M-F.  Also accepts Medicaid and self-pay.  °[facility] [place] [address], [place] Phone: (336) 878-6027   °[facility] [address], [place], [place] (336)723-1848, Ext. 123 Mondays & Thursdays: 7-9 AM.  First 15 patients are seen on a first come, first serve basis. °  ° °Medicaid-accepting [place] Providers: ° °Organization         Address  Phone   Notes  °[facility] [address], [place] (336) 641-2100 Also accepts self-pay patients.  °[facility] [address], [place] ° (336) 856-9996   °[facility] [address], [place] (336) 288-8857   °[facility] [address] [place] [address], [place] (336) 299-7000   °Veita Bland [address], [place]  ° (336) 373-1557 Only accepts  Access Medicaid patients after they have their name applied to their card.  ° °Self-Pay (no insurance) in [place]: ° °Organization         Address  Phone   Notes  °Sickle Cell Patients, [facility] [address], [place] (336) 832-1970   °[facility] [address], [place] (336) 832-4400   ° [facility] ° [address], [place] (336) 992-4800   °[facility]/Dr. Osei-Bonsu ° [address] [place] [address], [place] or [address], [place] (336) [phone] Phone number for both [place] and [place] locations is the same.  °[facility] [address], [place] (336) 299-0000   °[facility] [address] [place] [address], [place] or [address] (336) 852-7530 °(336) 878-2260   °[facility] [address], [place] (336) 350-1642, phone; (336)  294-5005, fax Sees patients 1st and 3rd Saturday of every month.  Must not qualify for public or private insurance (i.e. Medicaid, Medicare, [place] Health Choice, Veterans' Benefits) • Household income should be no more than 200% of the poverty level •The clinic cannot treat you if you are pregnant or think you are pregnant • Sexually transmitted diseases are not treated at the clinic.  ° ° °Dental Care: °Organization         Address  Phone  Notes  °[facility] [facility] [address],  (336) 641-6152 Accepts children up to age 21 who are enrolled in Medicaid or [place] Health Choice; pregnant women with a Medicaid card; and children who have applied for Medicaid or [place] Health Choice, but were declined, whose parents can pay a reduced fee at time of service.  °[place] [facility] [place]  [address], [place] (336) 641-7733 Accepts children up to age 21 who are enrolled in Medicaid or [place] Health Choice; pregnant women with a Medicaid card; and children who have applied for Medicaid or [place] Health Choice, but were declined, whose parents can pay a reduced fee at time of service.  °[facility] ° [address], [place] (336) 641-4533 Patients are seen by appointment only. Walk-ins are not accepted. [facility] will see patients 18 years of age and older. °Monday - Tuesday (8am-5pm) °Most Wednesdays (8:30-5pm) °$30 per visit, cash only  °[facility] ° [address], [place] (336) 641-4533 Patients are seen by appointment only. Walk-ins are not accepted. [facility] will see patients 18 years of age and older. °One Wednesday Evening (Monthly: Volunteer Based).  $30 per visit, cash only  °[facility]  (919) [phone] for adults; Children under age 4, call Graduate Pediatric Dentistry at ([phone]) [phone]. Children aged 4-14, please call ([phone])  [phone] to request a pediatric application. ° Dental services are provided in all areas of dental care including fillings, crowns and bridges, complete and partial dentures, implants, gum treatment, root canals, and extractions. Preventive care is also provided. Treatment is provided to both adults and children. °Patients are selected via a lottery and there is often a waiting list. °  °[facility] [address], °[place] ° (336) 763-8833 [email or web address] °  °[facility] [address], [place], [place] (336)723-1848, Ext. 123 Second and Fourth Thursday of each month, opens at 6:30 AM; Clinic ends at 9 AM.  Patients are seen on a first-come first-served basis, and a limited number are seen during each clinic.  ° °[facility] ° [address], [place], [place] (336) 723-7904   Eligibility Requirements °You must have lived in [place], [place], or [place] counties for at least the last three months. °  You cannot be eligible for state or federal sponsored healthcare insurance, including Veterans Administration, Medicaid, or Medicare. °  You generally cannot be eligible for healthcare insurance through your employer.  °  How to apply: °Eligibility screenings are held every Tuesday and Wednesday afternoon from 1:00 pm until 4:00 pm. You do not need an appointment for the interview!  °[facility] [address], [place], [place] 336-631-2330   °[facility]  336-342-8273   °[facility]  336-703-3100   °[facility]  336-570-6415   ° °Behavioral Health Resources in the Community: °Intensive Outpatient Programs °Organization         Address  Phone  Notes  °[facility] [address], [place], [place] 336-878-6098   °[facility] [address], [place], [place] 336-832-9800   °[facility] [address], [place], [place] ° 336-882-2125    °[facility] [address],  °,  1-800-853-5163 or 336-641-4981   °Substance Abuse Resources °Organization         Address  Phone  Notes  °[facility]  336-882-2125   °[facility]  336-784-9470   °[facility]  336-285-9073   °[facility]  336-845-3988   °Residential & Outpatient Substance Abuse Program  1-800-659-3381   °Psychological Services °Organization         Address  Phone  Notes  °[facility]    336- 832-9600   °[facility]  336- 378-7881   °[facility] [address], [place] 1-800-853-5163 or 336-641-4981   ° °Mobile Crisis Teams °Organization         Address  Phone  Notes  °[facility], Mobile Crisis Care Unit  1-877-626-1772   °Assertive °[facility] ° [address] [place], [place] 336-834-9664   °Sharon DeEsch [address] °[place] 336-554-5454   ° °Self-Help/Support Groups °Organization         Address  Phone             Notes  °[facility] [place] - variety of support groups  336- 373-1402 Call for more information  °Narcotics Anonymous (NA), [facility] [address], °[place]  2 meetings at this location  ° °Residential Treatment Programs °Organization         Address  Phone  Notes  °[facility] [address],    °[place]  1-866-801-8205   °[facility] ° [address], [place], [place] 704-293-8524   °[facility] [address], [place] 336-845-3988 Admissions: 8am-3pm M-F  °[facility] [address]    °[place], [place] 336-841-1104   °[facility] [address], [place], [place] 336-379-7146   °[facility] [address]  °[place], [place] 336-285-9073   °[facility] Intensive Outpatient [address], [place], [place] 336-852-3033   °[facility] [address]  °[place], [place] 1-877-615-2722 or 336-784-9470   °[facility] [address] [place], [place] 336-227-7417 Accepts Medicaid  °[facility] [address]  °[place] 1-800-659-3381 Substance Abuse/Addiction Treatment  ° °[place] Behavioral Health Resources °Organization         Address  Phone  Notes  °[facility]  (888) 581-9988   °Julie Brannon, PhD [address] [place], [place]   (336) 349-5553 or (336) 951-0000   °[facility]   [address] °[place], [place] (336) 349-4454   °[facility] [address], [place], [place] (336) 342-8316 Insurance/Medicaid/sponsorship through [facility]  °[facility] [address]                                    [place], [place] (336) 342-8316 Therapy/tele-psych/case  °[facility] [address]  ° [place], [place] (336) 349-2233    °Dr. Arfeen  (336) 349-4544   °[facility] [place]  [employer] [place] [facility] 1) [address], [place] °2) [address], [place] °3)  [address], [place] (336) 349-3220 °(336) 342-7768 ° °(336) 342-8140   °[place] [facility] (336) 342-1394 or (336) 342-3537 (After Hours)    ° ° ° °

## 2014-12-15 ENCOUNTER — Encounter: Payer: BLUE CROSS/BLUE SHIELD | Admitting: Medical

## 2014-12-15 NOTE — Progress Notes (Signed)
This encounter was created in error - please disregard.

## 2015-02-14 ENCOUNTER — Encounter: Payer: Self-pay | Admitting: Gynecology

## 2015-02-14 ENCOUNTER — Ambulatory Visit (INDEPENDENT_AMBULATORY_CARE_PROVIDER_SITE_OTHER): Payer: BLUE CROSS/BLUE SHIELD | Admitting: Gynecology

## 2015-02-14 VITALS — BP 120/78 | Ht 59.0 in | Wt 109.0 lb

## 2015-02-14 DIAGNOSIS — E288 Other ovarian dysfunction: Secondary | ICD-10-CM

## 2015-02-14 DIAGNOSIS — N8320 Unspecified ovarian cysts: Secondary | ICD-10-CM | POA: Diagnosis not present

## 2015-02-14 DIAGNOSIS — R634 Abnormal weight loss: Secondary | ICD-10-CM

## 2015-02-14 DIAGNOSIS — L292 Pruritus vulvae: Secondary | ICD-10-CM | POA: Diagnosis not present

## 2015-02-14 DIAGNOSIS — Z79890 Hormone replacement therapy (postmenopausal): Secondary | ICD-10-CM | POA: Diagnosis not present

## 2015-02-14 DIAGNOSIS — R6882 Decreased libido: Secondary | ICD-10-CM | POA: Diagnosis not present

## 2015-02-14 DIAGNOSIS — N83202 Unspecified ovarian cyst, left side: Secondary | ICD-10-CM

## 2015-02-14 DIAGNOSIS — E2839 Other primary ovarian failure: Secondary | ICD-10-CM

## 2015-02-14 LAB — COMPREHENSIVE METABOLIC PANEL
ALT: 10 U/L (ref 0–35)
AST: 15 U/L (ref 0–37)
Albumin: 4 g/dL (ref 3.5–5.2)
Alkaline Phosphatase: 76 U/L (ref 39–117)
BILIRUBIN TOTAL: 0.4 mg/dL (ref 0.2–1.2)
BUN: 10 mg/dL (ref 6–23)
CO2: 25 meq/L (ref 19–32)
Calcium: 8.9 mg/dL (ref 8.4–10.5)
Chloride: 104 mEq/L (ref 96–112)
Creat: 0.57 mg/dL (ref 0.50–1.10)
GLUCOSE: 65 mg/dL — AB (ref 70–99)
Potassium: 3.9 mEq/L (ref 3.5–5.3)
Sodium: 141 mEq/L (ref 135–145)
TOTAL PROTEIN: 6.4 g/dL (ref 6.0–8.3)

## 2015-02-14 LAB — WET PREP FOR TRICH, YEAST, CLUE
CLUE CELLS WET PREP: NONE SEEN
TRICH WET PREP: NONE SEEN

## 2015-02-14 LAB — TSH: TSH: 1.172 u[IU]/mL (ref 0.350–4.500)

## 2015-02-14 MED ORDER — FLUCONAZOLE 150 MG PO TABS: 150.0000 mg | ORAL_TABLET | Freq: Once | ORAL | Status: DC

## 2015-02-14 MED ORDER — EST ESTROGENS-METHYLTEST 1.25-2.5 MG PO TABS: 1.0000 | ORAL_TABLET | Freq: Every day | ORAL | Status: DC

## 2015-02-15 ENCOUNTER — Telehealth: Payer: Self-pay | Admitting: *Deleted

## 2015-02-15 LAB — HEMOGLOBIN A1C
Hgb A1c MFr Bld: 5.6 % (ref <5.7)
Mean Plasma Glucose: 114 mg/dL (ref <117)

## 2015-02-15 LAB — GC/CHLAMYDIA PROBE AMP
CT Probe RNA: NEGATIVE
GC Probe RNA: NEGATIVE

## 2015-02-15 MED ORDER — EST ESTROGENS-METHYLTEST 1.25-2.5 MG PO TABS: 1.0000 | ORAL_TABLET | Freq: Every day | ORAL | Status: DC

## 2015-02-15 NOTE — Telephone Encounter (Signed)
Pt called stating Estratest 1.25 mg daily was not at pharmacy, Rx was set on print, it will be called in and pt aware of this.

## 2015-02-15 NOTE — Progress Notes (Signed)
   Patient is a 44 year old who presented to the office today complaining of vulvar pruritus. 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 She is currently on Estrace 2 mg by mouth daily with the addition of Provera 10 mg for 10 days of the month. She is not suffering from any vasomotor symptoms and reports no vaginal bleeding. She was also complaining today of decreased libido as well as weight loss. She has history of a left ovarian cyst and is due for her follow-up ultrasound. She is in a monogamous relationship. She denies any dysuria or any frequency. She denies any fever, chills, nausea, or vomiting. Patient with no GU complaints.  Exam: Blood pressure 120/78  weight 109 pounds height 5 feet 11 inches tall   BMI 22.02 kg for meter square Gen. appearance well-developed we'll nourished female with above complaint Abdomen: Soft nontender no rebound or guarding Pelvic: Bartholin urethra Skene was within normal limits Vagina: No lesions or discharge Cervix: No lesions or discharge Uterus: Anteverted normal size shape and consistency Adnexa: Left adnexal fullness Rectal exam: Not done    Review of ultrasound from September 2015 demonstrated she had 2 cyst on the left ovary one measured 2.6 x 2.4 x 2.5 cm the second cyst measuring 3.4 x 3.4 x 3.1 cm essentially unchanged from previous study of 2014.  Wet prep today: Many yeast urinalysis 3-6 RBC few bacteria culture submitted   Assessment/plan:  #1 premature ovarian failure complaint decreased libido we'll change Estrace  To  Estratest 1.25 mg daily to give her a little testosterone to help with her libido. She will continue to take it daily with the addition of Provera 10 mg for 10 days of the month that she was doing before. #2For her yeast infection she'll be prescribed Diflucan 150 million units 1 by mouth today. #3 patient will return for pelvic ultrasound to follow-up on the left ovarian cyst if still  persistent will will proceed with laparoscopic cystectomy #4 as a result of her weight loss and tiredness and fatigue 1 to rule out diabetes as well as hypothyroidism by checking a hemoglobin A1c and a TSH. We'll also had a conference metabolic panel.

## 2015-03-01 ENCOUNTER — Ambulatory Visit: Payer: BLUE CROSS/BLUE SHIELD | Admitting: Gynecology

## 2015-03-01 ENCOUNTER — Other Ambulatory Visit: Payer: BLUE CROSS/BLUE SHIELD

## 2015-03-17 ENCOUNTER — Other Ambulatory Visit: Payer: BLUE CROSS/BLUE SHIELD

## 2015-03-17 ENCOUNTER — Ambulatory Visit: Payer: BLUE CROSS/BLUE SHIELD | Admitting: Gynecology

## 2015-05-12 ENCOUNTER — Ambulatory Visit (INDEPENDENT_AMBULATORY_CARE_PROVIDER_SITE_OTHER): Payer: BLUE CROSS/BLUE SHIELD

## 2015-05-12 ENCOUNTER — Ambulatory Visit (INDEPENDENT_AMBULATORY_CARE_PROVIDER_SITE_OTHER): Payer: BLUE CROSS/BLUE SHIELD | Admitting: Gynecology

## 2015-05-12 DIAGNOSIS — N8320 Unspecified ovarian cysts: Secondary | ICD-10-CM

## 2015-05-12 DIAGNOSIS — N83202 Unspecified ovarian cyst, left side: Secondary | ICD-10-CM

## 2015-05-12 NOTE — Progress Notes (Signed)
    patient is a 44-year-old who presented to the office today to discuss her ultrasound as part of follow-up for left ovarian cyst. Patient with history of premature ovarian failure has done well on Estratest 1.25 mg daily with the addition of Provera 10 mg for 10 days of the month. Patient reports no bleeding. Patient otherwise asymptomatic. Her history of ovarian cyst as follows:  Ultrasound 08/10/2014: Uterus measured 8.7 x 6.7 x 4.5 cm with endometrial stripe of 4.8 mm. Fluid collection or echogenic focus was noted as has been seen before at the C-section scar measuring 6 x 9 x 4 mm. Right ovary was atrophic. Left ovary echo free cysts one measuring 2.6 x 2.4 x 2.5 cm and a second one 3.4 x 3.4 x 3.1 cm. There was no fluid in the cul-de-sac. In 2014 the cyst measured 3.3 x 3.4 x 2.7 cm essentially unchanged  CA 125 was 9 08/10/2014  Ultrasound today: Uterus measured 9.8 x 6.4 x 3.6 cm with endometrial stripe of 4.2 mm. Right ovary normal. A left adnexal echo-free thinwall avascular cyst measuring 3.8 x 3.5 x 3.5 cm no fluid in the cul-de-sac.  Assessment/plan: Left ovarian cyst unchanged over the course of the past 6 months normal CA 125. Patient would like to wait for any surgical intervention she is not having any symptoms. We will check a CA 125 today and she will return back in 6 months for her annual exam and we will do the ultrasound if the cyst is still present or she has symptoms we discussed proceeding with laparoscopic ovarian cystectomy. Literature information was provided in [place].

## 2015-05-13 LAB — CA 125: CA 125: 6 U/mL (ref <35)

## 2015-06-01 ENCOUNTER — Ambulatory Visit: Payer: Self-pay | Admitting: Internal Medicine

## 2015-06-01 DIAGNOSIS — Z0289 Encounter for other administrative examinations: Secondary | ICD-10-CM

## 2015-06-02 ENCOUNTER — Telehealth: Payer: Self-pay | Admitting: Internal Medicine

## 2015-06-02 NOTE — Telephone Encounter (Signed)
No follow up needed

## 2015-06-02 NOTE — Telephone Encounter (Signed)
Patient did not come in for their appointment today for new pt appt.  Please let me know if patient needs to be contacted immediately for follow up or no follow up needed. °

## 2015-08-14 ENCOUNTER — Other Ambulatory Visit: Payer: Self-pay | Admitting: Gynecology

## 2015-08-24 ENCOUNTER — Encounter: Payer: BLUE CROSS/BLUE SHIELD | Admitting: Gynecology

## 2015-10-09 ENCOUNTER — Other Ambulatory Visit: Payer: Self-pay | Admitting: Gynecology

## 2015-10-18 ENCOUNTER — Other Ambulatory Visit: Payer: BLUE CROSS/BLUE SHIELD

## 2015-10-18 ENCOUNTER — Ambulatory Visit: Payer: BLUE CROSS/BLUE SHIELD | Admitting: Gynecology

## 2015-11-15 ENCOUNTER — Other Ambulatory Visit: Payer: BLUE CROSS/BLUE SHIELD

## 2015-11-15 ENCOUNTER — Ambulatory Visit: Payer: BLUE CROSS/BLUE SHIELD | Admitting: Gynecology

## 2015-11-20 ENCOUNTER — Other Ambulatory Visit: Payer: Self-pay | Admitting: Gynecology

## 2015-11-20 DIAGNOSIS — N83202 Unspecified ovarian cyst, left side: Secondary | ICD-10-CM

## 2015-12-10 ENCOUNTER — Other Ambulatory Visit: Payer: Self-pay | Admitting: Gynecology

## 2015-12-13 ENCOUNTER — Encounter: Payer: Self-pay | Admitting: Gynecology

## 2015-12-13 ENCOUNTER — Ambulatory Visit (INDEPENDENT_AMBULATORY_CARE_PROVIDER_SITE_OTHER): Payer: BLUE CROSS/BLUE SHIELD | Admitting: Gynecology

## 2015-12-13 ENCOUNTER — Other Ambulatory Visit: Payer: Self-pay | Admitting: Gynecology

## 2015-12-13 ENCOUNTER — Ambulatory Visit (INDEPENDENT_AMBULATORY_CARE_PROVIDER_SITE_OTHER): Payer: BLUE CROSS/BLUE SHIELD

## 2015-12-13 VITALS — BP 122/80 | Ht 59.0 in | Wt 109.0 lb

## 2015-12-13 DIAGNOSIS — N83202 Unspecified ovarian cyst, left side: Secondary | ICD-10-CM | POA: Diagnosis not present

## 2015-12-13 DIAGNOSIS — R102 Pelvic and perineal pain: Secondary | ICD-10-CM

## 2015-12-13 DIAGNOSIS — Q5128 Other doubling of uterus, other specified: Secondary | ICD-10-CM

## 2015-12-13 DIAGNOSIS — N941 Unspecified dyspareunia: Secondary | ICD-10-CM | POA: Diagnosis not present

## 2015-12-13 DIAGNOSIS — Q512 Other doubling of uterus, unspecified: Secondary | ICD-10-CM

## 2015-12-13 MED ORDER — EST ESTROGENS-METHYLTEST 1.25-2.5 MG PO TABS: 1.0000 | ORAL_TABLET | Freq: Every day | ORAL | Status: DC

## 2015-12-13 NOTE — Patient Instructions (Signed)
Quiste ovrico (Ovarian Cyst) Un quiste ovrico es una bolsa llena de lquido que se forma en el ovario. Los ovarios son los rganos pequeos que producen vulos en las mujeres. Se pueden formar varios tipos de quistes en los ovarios. La mayora no son cancerosos. Muchos de ellos no causan problemas y con frecuencia desaparecen solos. Algunos pueden provocar sntomas y requerir tratamiento. Los tipos ms comunes de quistes ovricos son los siguientes:  Quistes funcionales: estos quistes pueden aparecer todos los meses durante el ciclo menstrual. Esto es normal. Estos quistes suelen desaparecer con el prximo ciclo menstrual si la mujer no queda embarazada. En general, los quistes funcionales no tienen sntomas.  Endometriomas: estos quistes se forman a partir del tejido que recubre el tero. Tambin se denominan "quistes de chocolate" porque se llenan de sangre que se vuelve marrn. Este tipo de quiste puede provocar dolor en la zona inferior del abdomen durante la relacin sexual y con el perodo menstrual.  Cistoadenomas: este tipo se desarrolla a partir de las clulas que se ubican en el exterior del ovario. Estos quistes pueden ser muy grandes y causar dolor en la zona inferior del abdomen y durante la relacin sexual. Este tipo de quiste puede girar sobre s mismo, cortar el suministro de sangre y causar un dolor intenso. Tambin se puede romper con facilidad y provocar mucho dolor.  Quistes dermoides: este tipo de quiste a veces se encuentra en ambos ovarios. Estos quistes pueden contener diferentes tipos de tejidos del organismo, como piel, dientes, pelo o cartlago. Generalmente no tienen sntomas, a menos que sean muy grandes.  Quistes tecalutenicos: aparecen cuando se produce demasiada cantidad de cierta hormona (gonadotropina corinica humana) que estimula en exceso al ovario para que produzca vulos. Esto es ms frecuente despus de procedimientos que ayudan a la concepcin de un beb  (fertilizacin in vitro). CAUSAS   Los medicamentos para la fertilidad pueden provocar una afeccin mediante la cual se forman mltiples quistes de gran tamao en los ovarios. Esta se denomina sndrome de hiperestimulacin ovrica.  El sndrome del ovario poliqustico es una afeccin que puede causar desequilibrios hormonales, los cuales pueden dar como resultado quistes ovricos no funcionales. SIGNOS Y SNTOMAS  Muchos quistes ovricos no causan sntomas. Si se presentan sntomas, stos pueden ser:  Dolor o molestias en la pelvis.  Dolor en la parte baja del abdomen.  Dolor durante las relaciones sexuales.  Aumento del permetro abdominal (hinchazn).  Perodos menstruales anormales.  Aumento del dolor en los perodos menstruales.  Cese de los perodos menstruales sin estar embarazada. DIAGNSTICO  Estos quistes se descubren comnmente durante un examen de rutina o una exploracin ginecolgica anual. Es posible que se ordenen otros estudios para obtener ms informacin sobre el quiste. Estos estudios pueden ser:  Ecografas.  Radiografas de la pelvis.  Tomografa computada.  Resonancia magntica.  Anlisis de sangre. TRATAMIENTO  Muchos de los quistes ovricos desaparecen por s solos, sin tratamiento. Es probable que el mdico quiera controlar el quiste regularmente durante 2 o 3meses para ver si se produce algn cambio. En el caso de las mujeres en la menopausia, es particularmente importante controlar de cerca al quiste ya que el ndice de cncer de ovario en las mujeres menopusicas es ms alto. Cuando se requiere tratamiento, este puede incluir cualquiera de los siguientes:  Un procedimiento para drenar el quiste (aspiracin). Esto se puede realizar mediante el uso de una aguja grande y una ecografa. Tambin se puede hacer a travs de un procedimiento laparoscpico, En   este procedimiento, se inserta un tubo Guyette que emite luz y que tiene una pequea cmara en un  extremo (laparoscopio) a travs de una pequea incisin.  Ciruga para extirpar el quiste completo. Esto se puede realizar mediante una ciruga laparoscpica o Neomia Dear ciruga abierta, la cual implica realizar una incisin ms grande en la parte inferior del abdomen.  Tratamiento hormonal o pldoras anticonceptivas. Estos mtodos a veces se usan para ayudar a Barista. INSTRUCCIONES PARA EL CUIDADO EN EL HOGAR   Tome solo medicamentos de venta libre o recetados, segn las indicaciones del mdico.  Oceanographer a las consultas de control con su mdico segn las indicaciones.  Hgase exmenes plvicos regulares y pruebas de Papanicolaou. SOLICITE ATENCIN MDICA SI:   Los perodos se atrasan, son irregulares, dolorosos o cesan.  El dolor plvico o abdominal no desaparece.  El abdomen se agranda o se hincha.  Siente presin en la vejiga o no puede vaciarla completamente.  Siente dolor durante las [employer].  Tiene una sensacin de hinchazn, presin o Environmental manager.  Pierde peso sin razn aparente.  Siente un Engineer, maintenance (IT).  Est estreida.  Pierde el apetito.  Le aparece acn.  Nota un aumento del vello corporal y facial.  Lenora Boys de peso sin hacer modificaciones en su actividad fsica y en su dieta habitual.  Sospecha que est embarazada. SOLICITE ATENCIN MDICA DE INMEDIATO SI:   Siente cada vez ms dolor abdominal.  Tiene malestar estomacal (nuseas) y vomita.  Tiene fiebre que se presenta de [place] repentina.  Siente dolor abdominal al defecar.  Sus perodos menstruales son ms abundantes que lo habitual. ASEGRESE DE QUE:   Comprende estas instrucciones.  Controlar su afeccin.  Recibir ayuda de inmediato si no mejora o si empeora.   Esta informacin no tiene Theme park manager el consejo del mdico. Asegrese de hacerle al mdico cualquier pregunta que tenga.   Document Released: 08/07/2005 Document Revised:  11/02/2013 Elsevier Interactive Patient Education 2016 [employer]. Laparoscopia de diagnstico (Diagnostic Laparoscopy) La laparoscopia de diagnstico es un procedimiento que se hace para diagnosticar enfermedades en el abdomen. Durante su realizacin, se introduce en el abdomen un instrumento Dennin del tamao de un lpiz que tiene [place] luz, llamado laparoscopio, a travs de una incisin. El laparoscopio le permite al mdico observar los rganos internos. INFORME A SU MDICO:  Cualquier alergia que tenga.  Todos los [employer], incluidos vitaminas, hierbas, gotas oftlmicas, cremas y [address] de [address].  Problemas previos que usted o los [employer] de su familia hayan tenido con el uso de anestsicos.  Enfermedades de la sangre que tenga.  Si tiene cirugas previas.  Enfermedades que tenga. RIESGOS Y COMPLICACIONES  En general, se trata de un procedimiento seguro. Sin embargo, es posible que haya problemas, que pueden incluir lo siguiente:  Infeccin.  Hemorragia.  Lesiones en los rganos circundantes.  Reaccin alrgica a la anestesia usada durante el procedimiento. ANTES DEL PROCEDIMIENTO  No coma ni beba nada despus de la medianoche anterior al procedimiento o segn lo que le haya indicado el mdico.  Consulte a su mdico acerca de estos temas:  Cambiar o suspender los medicamentos que toma habitualmente.  Tomar medicamentos, como aspirina e ibuprofeno. Estos medicamentos pueden tener un efecto anticoagulante en la [place]. No tome estos medicamentos antes del procedimiento si el mdico le indica que no lo haga.  Haga planes para que una persona lo lleve de vuelta a su casa despus del procedimiento. PROCEDIMIENTO  Conley Rolls  administrarn un medicamento para ayudarlo a relajarse (sedante).  Le administrarn un medicamento que lo har dormir (anestesia general).  Se inflar el abdomen con un gas, lo que facilitar la observacin.  Le practicarn  incisiones pequeas en el abdomen.  A travs de las incisiones, se introducirn un laparoscopio y otros instrumentos pequeos en el abdomen.  Se puede tomar Lauris Poag de tejido de un rgano del abdomen para su anlisis.  Se retirarn los instrumentos del abdomen.  Se extraer el gas.  Las incisiones se cerrarn con puntos (suturas). DESPUS DEL PROCEDIMIENTO  Le controlarn con frecuencia la presin arterial, la frecuencia cardaca, la frecuencia respiratoria y Air cabin crew de oxgeno en la sangre hasta que haya desaparecido el efecto de los medicamentos administrados.   Esta informacin no tiene Theme park manager el consejo del mdico. Asegrese de hacerle al mdico cualquier pregunta que tenga.   Document Released: 10/28/2005 Document Revised: 11/18/2014 Elsevier Interactive Patient Education [employer]

## 2015-12-13 NOTE — Progress Notes (Signed)
   Patient is a 45 year old with long-standing history of persistent left ovarian cyst since 2013 now is having dyspareunia and has decided to proceed with previously recommended surgery. Review of her ultrasounds as follows:  2013: Left ovarian cyst 3.1 x 2.8 x 2.6 cm average size 2.8 cm 2014 left ovarian cyst 2.8 x 2.9 x 2.8 cm average size 2.8 cm 6 months later left ovarian cyst 3.3 x 3.4 x 2.7 cm average size 3.0 cm 2015: Left ovarian cyst 2.6 x 2.4 x 2.5 cm average size 2.5 cm second cyst measuring 3.4 x 3.4 x 3.1 cm average size 3.3 cm 2016 left ovarian cyst 3.8 x 3.5 x 3.5 cm average size 3.6 cm  Ultrasound today: Uterus measured 7.7 x 6.6 x 3.3 cm endometrial stripe 1.9 mm. Arcuate versus a sub-septate uterus noted right endometrial stripe 1.9 mm left endometrial stripe 1.5 mm R shape uterus. Right ovary normal. Left ovary removed tissue with a thinwall echo-free avascular cyst measuring 2.9 x 3.2 x 3.1 cm average size 3.1 cm fluid in the cul-de-sac 3.0 x 2.6 and meters.  CA 125 08/10/2014 value of 9 CA 125 05/12/2015 value of 6  Patient with history of premature ovarian failure since 2013 has been on Estratest 1.25 mg daily with the addition of Provera 10 mg for 10 days of the month.  Assessment/plan: 45 year old patient with history of premature ovarian failure. Patient with persistent left ovarian cyst now having discomfort during intercourse. Patient previously recommended to undergo laparoscopic removal of left tube and ovary is decided to proceed. We are going to schedule in the next few weeks. We'll need to see the patient for preoperative examination the week of the surgery. Literature information was provided on ovarian cyst as well as a laparoscopy. We'll check a CA 125 today to compare with previous studies

## 2015-12-14 ENCOUNTER — Telehealth: Payer: Self-pay

## 2015-12-14 LAB — CA 125: CA 125: 7 U/mL (ref <35)

## 2015-12-14 NOTE — Telephone Encounter (Signed)
I placed call to patient to discuss ins benefits and scheduling surgery. No answer and her voice mail was not set up so I could not leave her a message.

## 2015-12-15 ENCOUNTER — Telehealth: Payer: Self-pay | Admitting: *Deleted

## 2015-12-15 MED ORDER — MEDROXYPROGESTERONE ACETATE 10 MG PO TABS: ORAL_TABLET | ORAL | Status: DC

## 2015-12-15 NOTE — Telephone Encounter (Signed)
Pt was seen on office visit and Rx for provera 10 mg take 10 day monthly was never sent to pharmacy for OV on 12/13/15. Rx will be sent.

## 2015-12-19 ENCOUNTER — Telehealth: Payer: Self-pay

## 2015-12-19 NOTE — Telephone Encounter (Signed)
I called patient to discuss schedule Lap LSO, R Salpingectomy for L ovarian cyst and pelvic pain.  We discussed insurance benefits and patients estimated surgery prepayment due to GGA.  Patient is not in a position financially to have surgery at this time.  She said she is getting ready to file her taxes soon and may have money back from that that will assist her.    She said she will contact me when she is able to schedule.

## 2015-12-22 ENCOUNTER — Other Ambulatory Visit: Payer: Self-pay | Admitting: Gynecology

## 2015-12-22 ENCOUNTER — Telehealth: Payer: Self-pay

## 2015-12-22 MED ORDER — ESTRADIOL 2 MG PO TABS: 2.0000 mg | ORAL_TABLET | Freq: Every day | ORAL | Status: DC

## 2015-12-22 NOTE — Telephone Encounter (Signed)
Patient said the generic Estratest had gone up to $100.00 and she cannot afford this.  She asked if she could go back on the Estrace 2 mg like she had been on in 2015.  She said you changed her because of her sex drive but she cannot afford Estratest now.   Ok for Estrace 2 mg?

## 2015-12-22 NOTE — Telephone Encounter (Signed)
Patient called and left message about Rx being too expensive. I called and left message for her to call me to clarify message more.

## 2015-12-22 NOTE — Telephone Encounter (Signed)
Patient informed I would send Rx for Estrace . Stressed importance of continuing Provera 1-10 of mos as directed and she is fine with this and has been doing this as directed.

## 2015-12-22 NOTE — Telephone Encounter (Signed)
Uterus was prescribed Estrace 2 mg daily with the addition of the Provera 10 days of the month. Call in 30 tablets with 11 refills

## 2016-02-22 ENCOUNTER — Encounter: Payer: Self-pay | Admitting: Gynecology

## 2016-02-22 ENCOUNTER — Ambulatory Visit (INDEPENDENT_AMBULATORY_CARE_PROVIDER_SITE_OTHER): Payer: BLUE CROSS/BLUE SHIELD | Admitting: Gynecology

## 2016-02-22 VITALS — BP 112/70

## 2016-02-22 DIAGNOSIS — E288 Other ovarian dysfunction: Secondary | ICD-10-CM | POA: Diagnosis not present

## 2016-02-22 DIAGNOSIS — N83202 Unspecified ovarian cyst, left side: Secondary | ICD-10-CM

## 2016-02-22 DIAGNOSIS — Z113 Encounter for screening for infections with a predominantly sexual mode of transmission: Secondary | ICD-10-CM | POA: Diagnosis not present

## 2016-02-22 DIAGNOSIS — N938 Other specified abnormal uterine and vaginal bleeding: Secondary | ICD-10-CM | POA: Diagnosis not present

## 2016-02-22 DIAGNOSIS — N898 Other specified noninflammatory disorders of vagina: Secondary | ICD-10-CM

## 2016-02-22 DIAGNOSIS — E2839 Other primary ovarian failure: Secondary | ICD-10-CM

## 2016-02-22 LAB — WET PREP FOR TRICH, YEAST, CLUE
Trich, Wet Prep: NONE SEEN
YEAST WET PREP: NONE SEEN

## 2016-02-22 LAB — HIV ANTIBODY (ROUTINE TESTING W REFLEX): HIV: NONREACTIVE

## 2016-02-22 MED ORDER — TINIDAZOLE 500 MG PO TABS: ORAL_TABLET | ORAL | Status: DC

## 2016-02-22 NOTE — Progress Notes (Signed)
   Patient is a 45 year old with history of premature ovarian failure on Estratest 1.25 mg daily with the addition of Provera 10 mg daily for 10 days of the month presented to the office today stating she had not had a menstrual period for several months and then bled lightly for a few days stop and then readied. It is not telling. She also been complaining prior to her menses a clear vaginal discharge with fishy odor. She also would like to be tested for STD.  Review of her record indicated patient had been complaining of dyspareunia and left lower quadrant discomfort and had a 3 cm cyst and it was in the process of being scheduled for diagnostic laparoscopy and left ovarian cystectomy for possible Endometriosis but due to expenses she postponed her operation. Her last ultrasound . 2017 demonstrated the following:  Ultrasound today: Uterus measured 7.7 x 6.6 x 3.3 cm endometrial stripe 1.9 mm. Arcuate versus a sub-septate uterus noted right endometrial stripe 1.9 mm left endometrial stripe 1.5 mm R shape uterus. Right ovary normal. Left ovary removed tissue with a thinwall echo-free avascular cyst measuring 2.9 x 3.2 x 3.1 cm average size 3.1 cm fluid in the cul-de-sac 3.0 x 2.6 centimeters  Exam: Gen. appearance well-developed well-nourished female with the above mentioned complaint Abdomen: Soft nontender no rebound or guarding Pelvic: Bartholin urethra Skene was within normal limits Vagina: Menstrual blood present Wet prep and GC and Chlamydia culture obtained  Wet prep result: Many clue cells, few WBCs and too numerous to count bacteria  Assessment/plan: #1 history of premature ovarian failure on HRT first menstrual cycle in several months reassurance given #2 STD screening requested so a GC and Chlamydia culture along with an HIV, RPR, hepatitis B and C was obtained results pending at time of this dictation #3 clinical evidence of bacterial vaginosis will be treated with Tindamax 500 mg  tablets she will take 4 tablets today and 4 tablets in 24 hours #4 history of left ovarian cyst has schedule annual exam for the end of May we'll repeat ultrasound to compare with 3 months prior.

## 2016-02-23 LAB — HEPATITIS C ANTIBODY: HCV AB: NEGATIVE

## 2016-02-23 LAB — RPR

## 2016-02-23 LAB — HEPATITIS B SURFACE ANTIGEN: Hepatitis B Surface Ag: NEGATIVE

## 2016-02-23 LAB — GC/CHLAMYDIA PROBE AMP
CT PROBE, AMP APTIMA: NOT DETECTED
GC PROBE AMP APTIMA: NOT DETECTED

## 2016-03-13 ENCOUNTER — Other Ambulatory Visit: Payer: Self-pay | Admitting: Gynecology

## 2016-03-13 DIAGNOSIS — N83202 Unspecified ovarian cyst, left side: Secondary | ICD-10-CM

## 2016-04-04 ENCOUNTER — Encounter: Payer: BLUE CROSS/BLUE SHIELD | Admitting: Gynecology

## 2016-09-06 ENCOUNTER — Other Ambulatory Visit: Payer: Self-pay | Admitting: Gynecology

## 2016-09-09 ENCOUNTER — Ambulatory Visit: Payer: BLUE CROSS/BLUE SHIELD | Admitting: Gynecology

## 2016-09-09 DIAGNOSIS — Z0289 Encounter for other administrative examinations: Secondary | ICD-10-CM

## 2016-11-13 ENCOUNTER — Ambulatory Visit (INDEPENDENT_AMBULATORY_CARE_PROVIDER_SITE_OTHER): Payer: BLUE CROSS/BLUE SHIELD | Admitting: Gynecology

## 2016-11-13 ENCOUNTER — Telehealth: Payer: Self-pay | Admitting: *Deleted

## 2016-11-13 ENCOUNTER — Encounter: Payer: Self-pay | Admitting: Gynecology

## 2016-11-13 VITALS — BP 106/70 | Ht 59.75 in | Wt 110.0 lb

## 2016-11-13 DIAGNOSIS — E28319 Asymptomatic premature menopause: Secondary | ICD-10-CM

## 2016-11-13 DIAGNOSIS — Z79890 Hormone replacement therapy (postmenopausal): Secondary | ICD-10-CM

## 2016-11-13 DIAGNOSIS — N951 Menopausal and female climacteric states: Secondary | ICD-10-CM

## 2016-11-13 DIAGNOSIS — N644 Mastodynia: Secondary | ICD-10-CM | POA: Diagnosis not present

## 2016-11-13 DIAGNOSIS — Z01411 Encounter for gynecological examination (general) (routine) with abnormal findings: Secondary | ICD-10-CM | POA: Diagnosis not present

## 2016-11-13 DIAGNOSIS — F3289 Other specified depressive episodes: Secondary | ICD-10-CM

## 2016-11-13 DIAGNOSIS — G4700 Insomnia, unspecified: Secondary | ICD-10-CM | POA: Diagnosis not present

## 2016-11-13 DIAGNOSIS — N83202 Unspecified ovarian cyst, left side: Secondary | ICD-10-CM

## 2016-11-13 MED ORDER — ESTRADIOL 2 MG PO TABS: 2.0000 mg | ORAL_TABLET | Freq: Every day | ORAL | 11 refills | Status: DC

## 2016-11-13 MED ORDER — FLUOXETINE HCL 10 MG PO TABS: 10.0000 mg | ORAL_TABLET | Freq: Every day | ORAL | 11 refills | Status: DC

## 2016-11-13 MED ORDER — MEDROXYPROGESTERONE ACETATE 10 MG PO TABS: 10.0000 mg | ORAL_TABLET | Freq: Every day | ORAL | 4 refills | Status: DC

## 2016-11-13 MED ORDER — MEGESTROL ACETATE 40 MG PO TABS: ORAL_TABLET | ORAL | 4 refills | Status: DC

## 2016-11-13 NOTE — Progress Notes (Signed)
Kelsey Bowen Jul 31, 1971 [id number]   History:    46 y.o.  for annual gyn exam with several complaints today. Patient with history of premature ovarian failure had been on Estratest 1.25 mg daily with the addition of Provera 10 mg for 10 days of the month but had to change to regimen to Estrace 2 mg daily because of the cost of the Estratest. She stopped the medication for 3 months has complaining of hot flashes, irritability, mood swing and insomnia she states that she has suffer from depression whereby she feels withdrawn feels like crying loss of appetite even before she went on hormonal replacement therapy. Patient last year was scheduled to undergo laparoscopic ovarian cystectomy did cancel her surgery.  Her record had demonstrated that she been complaining of dyspareunia and left lower quadrant discomfort and had a 3 cm cyst. Her most recent ultrasound was in April 2017 which demonstrated the following: Ultrasound today: Uterus measured 7.7 x 6.6 x 3.3 cm endometrial stripe 1.9 mm. Arcuate versus a sub-septate uterus noted right endometrial stripe 1.9 mm left endometrial stripe 1.5 mm R shape uterus. Right ovary normal. Left ovary removed tissue with a thinwall echo-free avascular cyst measuring 2.9 x 3.2 x 3.1 cm average size 3.1 cm fluid in the cul-de-sac 3.0 x 2.6 centimeters  Patient also has been complaining for the past month of right breast tenderness around the 12:00 position no nipple discharge no palpable masses. Her last mammogram was in 2015.  Past medical history,surgical history, family history and social history were all reviewed and documented in the EPIC chart.  Gynecologic History Patient's last menstrual period was 09/13/2016. Contraception: post menopausal status Last Pap: 2015. Results were: normal Last mammogram: 2015. Results were: normal  Obstetric History OB History  Gravida Para Term Preterm AB Living  4 4 3 1   4   SAB TAB Ectopic Multiple Live Births       4    # Outcome Date GA Lbr Len/2nd Weight Sex Delivery Anes PTL Lv  4 Term     F CS-Unspec  N LIV  3 Term     M CS-Unspec  N LIV  2 Term     F Vag-Spont  N LIV  1 Preterm     F CS-Unspec  Y LIV       ROS: A ROS was performed and pertinent positives and negatives are included in the history.  GENERAL: No fevers or chills. HEENT: No change in vision, no earache, sore throat or sinus congestion. NECK: No pain or stiffness. CARDIOVASCULAR: No chest pain or pressure. No palpitations. PULMONARY: No shortness of breath, cough or wheeze. GASTROINTESTINAL: No abdominal pain, nausea, vomiting or diarrhea, melena or bright red blood per rectum. GENITOURINARY: No urinary frequency, urgency, hesitancy or dysuria. MUSCULOSKELETAL: No joint or muscle pain, no back pain, no recent trauma. DERMATOLOGIC: No rash, no itching, no lesions. ENDOCRINE: No polyuria, polydipsia, no heat or cold intolerance. No recent change in weight. HEMATOLOGICAL: No anemia or easy bruising or bleeding. NEUROLOGIC: No headache, seizures, numbness, tingling or weakness. PSYCHIATRIC: No depression, no loss of interest in normal activity or change in sleep pattern.     Exam: chaperone present  BP 106/70   Ht 4' 11.75" (1.518 m)   Wt 110 lb (49.9 kg)   LMP 09/13/2016   BMI 21.66 kg/m   Body mass index is 21.66 kg/m.  General appearance : Well developed well nourished female. No acute distress HEENT: Eyes: no retinal  hemorrhage or exudates,  Neck supple, trachea midline, no carotid bruits, no thyroidmegaly Lungs: Clear to auscultation, no rhonchi or wheezes, or rib retractions  Heart: Regular rate and rhythm, no murmurs or gallops Breast:Examined in sitting and supine position were symmetrical in appearance, no palpable masses or tenderness,  no skin retraction, no nipple inversion, no nipple discharge, no skin discoloration, no axillary or supraclavicular lymphadenopathy Abdomen: no palpable masses or tenderness, no  rebound or guarding Extremities: no edema or skin discoloration or tenderness  Pelvic:  Bartholin, Urethra, Skene Glands: Within normal limits             Vagina: No gross lesions or discharge  Cervix: No gross lesions or discharge  Uterus  anteverted, normal size, shape and consistency, non-tender and mobile  Adnexa  Without masses or tenderness  Anus and perineum  normal   Rectovaginal  normal sphincter tone without palpated masses or tenderness             Hemoccult not indicated     Assessment/Plan:  46 y.o. female for annual exam with history of premature ovarian failure which may be contributing to a lot of her symptoms such as hot flashes, irritability, and mood swing. A cousin of her history of depression and on no medication we discussed risks benefits and pros and cons of starting her on Prozac 10 mg daily which she would like to start. This may help also sleep as well and help with some of the symptoms in addition to the Estrace 2 mg daily. Since she states that she has decreased appetite instead of giving her the Provera 10 mg for 10 days a month I'm going to give her Megace 40 mg daily for 12 days of the month which can also increase her appetite. Patient did have a full STD screen in April of this year to include HIV and all was negative. She will be referred to the radiologist for diagnostic mammogram of the right breast where she is the most tender although I did not feel a mass in a routine screening mammogram on the contralateral breast. She will return to the office next week for pelvic ultrasound to follow-up on her ovarian cyst and she will come in a fasting state for the following screening blood work: Comprehensive metabolic panel, fasting lipid profile, TSH, CBC, and urinalysis. Pap smear was done today.  In addition to her annual exam and additional 15 minutes was spent on the diagnosis and treatment of depression, anxiety, is insomnia as well as hormonal replacement therapy  and breast tenderness as well as ovarian cysts.   Ok Edwards MD, 2:57 PM 11/13/2016

## 2016-11-13 NOTE — Telephone Encounter (Signed)
Appointment at breast center on 11/19/16 @ 7:40am pt informed, she also asked me to leave on her voicemail as she was driving, this was done.

## 2016-11-13 NOTE — Addendum Note (Signed)
Addended by: Berna SpareASTILLO, Jadamarie Butson A on: 11/13/2016 03:27 PM   Modules accepted: Orders

## 2016-11-13 NOTE — Telephone Encounter (Signed)
-----   Message from Ok EdwardsJuan H Fernandez, MD sent at 11/13/2016  2:53 PM EST ----- Kelsey DikeJennifer, please schedule diagnostic mammogram for this patient with breast tenderness 12:00 position 3 finger breast from the areolar region. She needs a screening mammogram on the contralateral breast.

## 2016-11-13 NOTE — Patient Instructions (Signed)
Fluoxetine capsules or tablets (Depression/Mood Disorders) Qu es este medicamento? La FLUOXETINA pertenece a un grupo de medicamentos llamados inhibidores selectivos de la recaptacin de serotonina (ISRS). Ayuda a tratar problemas de estado de nimo, tales como depresin, trastorno obsesivo-compulsivo y trastorno de pnico. Tambin puede tratar ciertos trastornos de la alimentacin. Este medicamento puede ser utilizado para otros usos; si tiene alguna pregunta consulte con su proveedor de atencin mdica o con su farmacutico. MARCAS COMUNES: Prozac Qu le debo informar a mi profesional de la salud antes de tomar este medicamento? Necesitan saber si usted presenta alguno de los Coventry Health Caresiguientes problemas o situaciones: trastorno bipolar o antecedentes familiares de trastorno bipolar trastornos de sangrado glaucoma enfermedad cardiaca enfermedad heptica bajos niveles de sodio en la sangre convulsiones ideas, planes o intento de suicidio; si usted o alguien de su familia ha intentado suicidarse previamente si toma IMAO, tales como Carbex, Eldepryl, Marplan, Nardil y Parnate si toma medicamentos que tratan o previenen cogulos sanguneos enfermedad tiroidea una reaccin alrgica o inusual a la fluoxetina, a otros medicamentos, alimentos, colorantes o conservantes si est embarazada o buscando quedar embarazada si est amamantando a un beb Cmo debo utilizar este medicamento? Tome este medicamento por va oral con un vaso de agua. Siga las instrucciones de la etiqueta del Geneseomedicamento. Puede tomar este medicamento con o sin alimentos. Tome sus dosis a intervalos regulares. No tome su medicamento con una frecuencia mayor a la indicada. No deje de tomar PPL Corporationeste medicamento repentinamente a menos que as indique su mdico. El detener este medicamento demasiado rpido puede causar efectos secundarios graves o puede empeorar su condicin. Su farmacutico le dar una Gua del medicamento especial con cada receta y  relleno. Asegrese de leer esta informacin cada vez cuidadosamente. Hable con su pediatra para informarse acerca del uso de este medicamento en nios. Aunque este medicamento puede ser recetado a nios tan menores como de 7 aos de edad para condiciones selectivas, las precauciones se aplican. Sobredosis: Pngase en contacto inmediatamente con un centro toxicolgico o una sala de urgencia si usted cree que haya tomado demasiado medicamento. ATENCIN: Reynolds AmericanEste medicamento es solo para usted. No comparta este medicamento con nadie. Qu sucede si me olvido de una dosis? Si olvida una dosis, sltese la dosis Monacoolvidada y vuelva al horario habitual de sus dosis. No tome dosis adicionales o dobles. Qu puede interactuar con este medicamento? No tome este medicamento con ninguno de los siguientes medicamentos: -otros medicamentos que contienen fluoxetina, tales como Sarafem o Symbyax -cisapride -linezolid -IMAOs, tales como Carbex, Eldepryl, Marplan, Nardil y Parnate -azul de metileno (Paramedicva intravenosa) -pimozida -tioridazina Este medicamentotambin puede interactuar con los siguientes medicamentos: -alcohol -aspirina o medicamentos tipo aspirina -carbamazepina -ciertos medicamentos para la depresin, ansiedad o trastornos psicticos -ciertos medicamentos para migraas, tales como almotriptn, eletriptn, frovatriptn, naratriptn, rizatriptn, sumatriptn, zolmitriptn -digoxina -diurticos -fentanilo -flecainida -furazolidona -isoniazida -litio -medicamentos para conciliar el sueo -medicamentos que tratan o previenen cogulos sanguneos, tales como warfarina, enoxaparina y dalteparina -AINE, medicamentos para el dolor o la inflamacin, tales como el ibuprofeno o naproxeno -fenitona -procarbazina -propafenona -rasagilina -ritonavir -suplementos como hierba de [place]San Zahria Ding, kava kava, valeriana -tramadol -triptfano -vinblastina Puede ser que esta lista no menciona todas las posibles  interacciones. Informe a su profesional de Beazer Homesla salud de Ingram Micro Inctodos los productos a base de hierbas, medicamentos de Old[place]venta libre o suplementos nutritivos que est tomando. Si usted fuma, consume bebidas alcohlicas o si utiliza drogas ilegales, indqueselo tambin a su profesional de Beazer Homesla salud. Algunas sustancias pueden interactuar con su medicamento.  A qu debo estar atento al usar PPL Corporation? Informe a su mdico si sus sntomas no mejoran o si empeoran. Visite a su mdico o a su profesional de la salud para chequear su evolucin peridicamente. Debido que puede ser necesario tomar este medicamento durante varias semanas para que sea posible observar sus efectos en forma [place], es importante que sigue su tratamiento como recetado por su mdico. Los pacientes y sus familias deben estar atentos si empeora la depresin o ideas suicidas. Tambin est atento a cambios repentinos o severos de emocin, tales como el sentirse ansioso, agitado, lleno de pnico, irritable, hostil, agresivo, impulsivo, inquietud severa, demasiado excitado y hiperactivo o dificultad para conciliar el sueo. Si esto ocurre, especialmente al comenzar con el tratamiento o al cambiar de dosis, comunquese con su mdico. Puede experimentar somnolencia o [employer]. No conduzca ni utilice maquinaria, ni haga nada que Scientist, research (life sciences) en estado de alerta hasta que sepa cmo le afecta este medicamento. No se siente ni se ponga de pie con rapidez, especialmente si es un paciente de edad avanzada. Esto reduce el riesgo de mareos o [employer]. El alcohol puede interferir con el efecto de [place]. Evite consumir bebidas alcohlicas. Se le podr secar la boca. Masticar chicle sin azcar, chupar caramelos duros y tomar agua en abundancia le ayudar a mantener la boca hmeda. Si el problema no desaparece o es severo, consulte a su mdico. Este medicamento puede afectar sus niveles de Banker. Si tiene diabetes, consulte con su  mdico o profesional de la salud antes de cambiar su dieta o la dosis de su medicamento para la diabetes. Qu efectos secundarios puedo tener al [employer] este medicamento? Efectos secundarios que debe informar a su mdico o a Producer, television/film/video de la salud tan pronto como sea posible: Therapist, art, como erupcin cutnea, comezn/picazn o urticarias, e hinchazn de la cara, los labios o la lengua heces de color negro y aspecto alquitranado problemas respiratorios cambios en la visin confusin estado de nimo elevado, menor necesidad de dormir, pensamientos acelerados, conducta impulsiva dolor ocular ritmo cardiaco rpido, irregular sensacin de desmayos o aturdimiento, cadas alucinaciones, prdida del contacto con la realidad prdida de equilibrio o coordinacin prdida de memoria inquietud, caminar de un lado a otro, incapacidad para quedarse quieto convulsiones rigidez de los Exelon Corporation ideas suicidas u otros cambios en el estado de nimo hinchazn o enrojecimiento en o alrededor del ojo dificultad para conciliar el sueo sangrado o moretones inusuales Efectos secundarios que generalmente no requieren atencin mdica (infrmelos a su mdico o a Producer, television/film/video de la salud si persisten o si son molestos): cambios en el deseo o desempeo sexual diarrea boca seca dolor de cabeza aumento de la sudoracin prdida del apetito nuseas Puede ser que esta lista no menciona todos los posibles efectos secundarios. Comunquese a su mdico por asesoramiento mdico Hewlett[employer]ard. Usted puede informar los efectos secundarios a la FDA por telfono al 1-800-FDA-1088. Dnde debo guardar mi medicina? Mantngala fuera del alcance de los nios. Gurdela a Sanmina-SCI, entre 15 y 30 grados C (66 y 33 grados F). Deseche todo el medicamento que no haya utilizado, despus de la fecha de vencimiento. ATENCIN: Este folleto es un resumen. Puede ser que no cubra toda la posible informacin. Si usted  tiene preguntas acerca de esta medicina, consulte con su mdico, su farmacutico o su profesional de Radiographer, therapeutic.  2017 Elsevier/Gold Standard (2016-03-18 00:00:00)

## 2016-11-14 LAB — URINALYSIS W MICROSCOPIC + REFLEX CULTURE
BACTERIA UA: NONE SEEN [HPF]
Bilirubin Urine: NEGATIVE
CASTS: NONE SEEN [LPF]
CRYSTALS: NONE SEEN [HPF]
Glucose, UA: NEGATIVE
Hgb urine dipstick: NEGATIVE
Ketones, ur: NEGATIVE
Leukocytes, UA: NEGATIVE
Nitrite: NEGATIVE
PROTEIN: NEGATIVE
SPECIFIC GRAVITY, URINE: 1.024 (ref 1.001–1.035)
WBC, UA: NONE SEEN WBC/HPF (ref <=5)
Yeast: NONE SEEN [HPF]
pH: 6 (ref 5.0–8.0)

## 2016-11-14 LAB — PAP IG W/ RFLX HPV ASCU

## 2016-11-15 LAB — URINE CULTURE: ORGANISM ID, BACTERIA: NO GROWTH

## 2016-11-18 ENCOUNTER — Other Ambulatory Visit: Payer: BLUE CROSS/BLUE SHIELD

## 2016-11-18 ENCOUNTER — Ambulatory Visit: Payer: BLUE CROSS/BLUE SHIELD | Admitting: Gynecology

## 2016-11-19 ENCOUNTER — Other Ambulatory Visit: Payer: Self-pay

## 2016-11-25 ENCOUNTER — Other Ambulatory Visit: Payer: Self-pay

## 2016-12-02 ENCOUNTER — Ambulatory Visit
Admission: RE | Admit: 2016-12-02 | Discharge: 2016-12-02 | Disposition: A | Discharge status: Home / Self Care | Payer: BLUE CROSS/BLUE SHIELD | Source: Ambulatory Visit | Attending: Gynecology | Admitting: Gynecology

## 2016-12-02 DIAGNOSIS — N644 Mastodynia: Secondary | ICD-10-CM

## 2017-03-26 ENCOUNTER — Encounter: Payer: Self-pay | Admitting: Gynecology

## 2017-11-17 ENCOUNTER — Other Ambulatory Visit: Payer: Self-pay

## 2017-11-20 ENCOUNTER — Other Ambulatory Visit: Payer: Self-pay | Admitting: *Deleted

## 2017-11-20 MED ORDER — ESTRADIOL 2 MG PO TABS: 2.0000 mg | ORAL_TABLET | Freq: Every day | ORAL | 0 refills | Status: DC

## 2018-12-31 LAB — PULMONARY FUNCTION TEST

## 2019-01-17 ENCOUNTER — Encounter: Payer: Self-pay | Admitting: Obstetrics & Gynecology

## 2019-05-11 ENCOUNTER — Ambulatory Visit: Payer: BLUE CROSS/BLUE SHIELD | Admitting: Registered Nurse

## 2019-05-19 ENCOUNTER — Telehealth: Payer: BC Managed Care – PPO | Admitting: Registered Nurse

## 2019-05-19 ENCOUNTER — Ambulatory Visit: Payer: BC Managed Care – PPO | Admitting: Registered Nurse

## 2019-05-19 ENCOUNTER — Telehealth: Payer: Self-pay | Admitting: Registered Nurse

## 2019-05-19 NOTE — Telephone Encounter (Signed)
CANCELLED APPNT

## 2019-12-09 ENCOUNTER — Other Ambulatory Visit: Payer: Self-pay

## 2019-12-13 ENCOUNTER — Ambulatory Visit (INDEPENDENT_AMBULATORY_CARE_PROVIDER_SITE_OTHER): Payer: BC Managed Care – PPO | Admitting: Obstetrics & Gynecology

## 2019-12-13 ENCOUNTER — Other Ambulatory Visit (HOSPITAL_COMMUNITY)
Admission: RE | Admit: 2019-12-13 | Discharge: 2019-12-13 | Disposition: A | Discharge status: Home / Self Care | Payer: BC Managed Care – PPO | Source: Ambulatory Visit | Attending: Obstetrics & Gynecology | Admitting: Obstetrics & Gynecology

## 2019-12-13 ENCOUNTER — Other Ambulatory Visit: Payer: Self-pay

## 2019-12-13 ENCOUNTER — Encounter: Payer: Self-pay | Admitting: Gastroenterology

## 2019-12-13 ENCOUNTER — Encounter: Payer: Self-pay | Admitting: Obstetrics & Gynecology

## 2019-12-13 VITALS — BP 100/60 | HR 80 | Temp 97.2°F | Resp 10 | Ht 59.25 in | Wt 111.0 lb

## 2019-12-13 DIAGNOSIS — Z124 Encounter for screening for malignant neoplasm of cervix: Secondary | ICD-10-CM | POA: Diagnosis present

## 2019-12-13 DIAGNOSIS — Z1211 Encounter for screening for malignant neoplasm of colon: Secondary | ICD-10-CM | POA: Diagnosis not present

## 2019-12-13 DIAGNOSIS — Z01419 Encounter for gynecological examination (general) (routine) without abnormal findings: Secondary | ICD-10-CM | POA: Diagnosis not present

## 2019-12-13 MED ORDER — ESTRADIOL 2 MG PO TABS: 2.0000 mg | ORAL_TABLET | Freq: Every day | ORAL | 0 refills | Status: DC

## 2019-12-13 MED ORDER — MEDROXYPROGESTERONE ACETATE 10 MG PO TABS: ORAL_TABLET | ORAL | 0 refills | Status: DC

## 2019-12-13 NOTE — Progress Notes (Addendum)
49 y.o. [id number] Single Hispanic female here for new gyn exam.  Former patient of Dr. Lily Peer.  Saw a provider at [facility] [address] for a year or two but she has moved away.  Is on HRT since her early 17's.  Tried to stop on her own and is now having hot flashes again.  Would like to restart.  Dosages reviewed with pt.  Spanish translator here with pt and present for entire conversation.  Patient's last menstrual period was 04/12/2019 (within months).          Sexually active: No.  The current method of family planning is tubal ligation.    Exercising: Yes.    occasional squats Smoker:  no  Health Maintenance: Pap:  [facility] done about 2 years ago -- normal per patient (pap only 12/31/2018 neg) History of abnormal Pap:  no MMG: Done at [facility] about 2 years ago (pt feels did one in 2020).  Outside records:  Normal MMG 01/14/2019. Colonoscopy:  never BMD:   n/a TDaP:  unsure Pneumonia vaccine(s):  n/a Shingrix:   unsure Hep C testing: 02/22/16 Neg Screening Labs: discuss today   reports that she has never smoked. She has never used smokeless tobacco. She reports that she does not drink alcohol or use drugs.  Past Medical History:  Diagnosis Date  . Anxiety   . Chloasma   . Ovarian cyst   . Ovarian cyst   . Premature ovarian failure     Past Surgical History:  Procedure Laterality Date  . CESAREAN SECTION     x4  . TUBAL LIGATION      Current Outpatient Medications  Medication Sig Dispense Refill  . estradiol (ESTRACE) 2 MG tablet Take 1 tablet (2 mg total) by mouth daily. 30 tablet 0  . megestrol (MEGACE) 40 MG tablet Take one tablet daily for 12 days of the month 36 tablet 4   No current facility-administered medications for this visit.    Family History  Problem Relation Age of Onset  . Diabetes Maternal Aunt     Review of Systems  All other systems reviewed and are negative.   Exam:   BP 100/60 (BP Location: Right  Arm, Patient Position: Sitting, Cuff Size: Normal)   Pulse 80   Temp (!) 97.2 F (36.2 C) (Temporal)   Resp 10   Ht 4' 11.25" (1.505 m)   Wt 111 lb (50.3 kg)   LMP 04/12/2019 (Within Months)   BMI 22.23 kg/m    Height: 4' 11.25" (150.5 cm)  Ht Readings from Last 3 Encounters:  12/13/19 4' 11.25" (1.505 m)  11/13/16 4' 11.75" (1.518 m)  12/13/15 4\' 11"  (1.499 m)    General appearance: alert, cooperative and appears stated age Head: Normocephalic, without obvious abnormality, atraumatic Neck: no adenopathy, supple, symmetrical, trachea midline and thyroid normal to inspection and palpation Lungs: clear to auscultation bilaterally Breasts: normal appearance, no masses or tenderness Heart: regular rate and rhythm Abdomen: soft, non-tender; bowel sounds normal; no masses,  no organomegaly Extremities: extremities normal, atraumatic, no cyanosis or edema Skin: Skin color, texture, turgor normal. No rashes or lesions Lymph nodes: Cervical, supraclavicular, and axillary nodes normal. No abnormal inguinal nodes palpated Neurologic: Grossly normal   Pelvic: External genitalia:  no lesions              Urethra:  normal appearing urethra with no masses, tenderness or lesions  Bartholins and Skenes: normal                 Vagina: normal appearing vagina with normal color and discharge, no lesions              Cervix: no lesions              Pap taken: Yes.   Bimanual Exam:  Uterus:  normal size, contour, position, consistency, mobility, non-tender              Adnexa: normal adnexa and no mass, fullness, tenderness               Rectovaginal: Confirms               Anus:  normal sphincter tone, no lesions  Chaperone, Terence Lux, CMA, was present for exam.  A:  Well Woman with normal exam PMP with hx of HRT use  P:   Mammogram guidelines reviewed.  Pt aware she should be having one yearly if continuing HRT Release for MMG obtained today pap smear with HR HPV  obtained today Estradiol 2mg  daily.  D/w pt trying 1/2 tab instead of whole tablet to see if relieves symptoms without higher dosage.  She will also take provera 10mg  days 1-15 each month.  Advised she may have bleeding and should call if this occurs.  Referral for screening colonoscopy made today as well. Consider BMD with MMG between now and 53 due to earlier menopause Release of records from [facility] signed today to see what lab work pt may need Return annually or prn  01/11/2020: outside records obtained.  Nomral pap 2/202/2020.  No H HPV testing done.  PTH 87.  Total chol 226, LDL 142, TG 124, HDL, 60.  Free t4 1.28.  MMG 01/14/2019 at [place].

## 2019-12-14 LAB — CYTOLOGY - PAP
Adequacy: ABSENT
Comment: NEGATIVE
Diagnosis: NEGATIVE
High risk HPV: NEGATIVE

## 2019-12-28 ENCOUNTER — Encounter: Payer: BLUE CROSS/BLUE SHIELD | Admitting: Gastroenterology

## 2020-01-04 ENCOUNTER — Telehealth: Payer: Self-pay | Admitting: Obstetrics & Gynecology

## 2020-01-04 NOTE — Telephone Encounter (Signed)
Spoke with patient. Patient reports right breast pain and tenderness for 1wk. Denies any lumps, skin changes or nipple d/c. Has not started Estradiol or provera prescribed at [facility] on 12/13/19. Last MMG at [facility] on 01/14/19. Patient denies fever/chills.   Patient works night shift, scheduled for 8am on 2/24 with Leota Sauers, CNM. Covid 19 screening negative, precautions reviewed. Patient is agreeable to date and time.   Routing to provider for final review. Patient is agreeable to disposition. Will close encounter.   Cc: Leota Sauers, CNM

## 2020-01-04 NOTE — Telephone Encounter (Signed)
Patient is having right breast pain.

## 2020-01-04 NOTE — Telephone Encounter (Signed)
Call to patient, no voicemail, option for SMS sent.

## 2020-01-04 NOTE — Progress Notes (Signed)
   Subjective:  Interpreter here for visit. 49 y.o. Single Hispanic female presents for evaluation of right breast tenderness and mass in right breast. Onset of the symptoms was 2 weeks ago. Patient sought evaluation because of breast tenderness and mass.  She noted tenderness when bathing and then noted "a bump in breast". No family history of breast cancer. Denies chills, fatigue, fevers or sweats. Patient denies history of trauma, bites, or injuries to breast. Last mammogram was 01/19/2019 with scattered calcifications and D density. Previous to that mammogram was 12/02/16 at which time she had felt a lump, but benign findings. Denies nipple discharge or redness.    Review of Systems Pertinent items noted in HPI and remainder of comprehensive ROS otherwise negative.   Objective:   General appearance: alert, cooperative, appears stated age and no distress  Breasts: Left breast no masses or tenderness, slight prominent axillary lymph node in area below axilla. No nipple discharge Right breast: tenderness noted at 4 o'clock, no redness, small pea size mass in same area, axillary lymph node enlarged in upper axillary area, no nipple discharge   Assessment:   ASSESSMENT:Patient is diagnosed with Right breast tenderness with enlarged lymph nodes bilateral and small pea size mass in right breast   Plan:   PLAN: Discussed findings with patient and need for evaluation with diagnostic mammogram and [place]. She is aware she will be called with appointment information. Rv prn

## 2020-01-05 ENCOUNTER — Ambulatory Visit (INDEPENDENT_AMBULATORY_CARE_PROVIDER_SITE_OTHER): Payer: BC Managed Care – PPO | Admitting: Certified Nurse Midwife

## 2020-01-05 ENCOUNTER — Other Ambulatory Visit: Payer: Self-pay

## 2020-01-05 ENCOUNTER — Encounter: Payer: Self-pay | Admitting: Certified Nurse Midwife

## 2020-01-05 ENCOUNTER — Telehealth: Payer: Self-pay | Admitting: *Deleted

## 2020-01-05 VITALS — BP 90/64 | HR 68 | Temp 97.9°F | Resp 16 | Wt 113.0 lb

## 2020-01-05 DIAGNOSIS — N6310 Unspecified lump in the right breast, unspecified quadrant: Secondary | ICD-10-CM

## 2020-01-05 DIAGNOSIS — R599 Enlarged lymph nodes, unspecified: Secondary | ICD-10-CM

## 2020-01-05 DIAGNOSIS — N644 Mastodynia: Secondary | ICD-10-CM

## 2020-01-05 NOTE — Telephone Encounter (Signed)
-----   Message from Verner Chol, CNM sent at 01/05/2020  1:10 PM EST ----- Patient needs diagnostic mammogram and [place] right breast tenderness and mass notedAxillary lymph node enlargement bilateral will need USShe is aware she will be called with appointment information

## 2020-01-05 NOTE — Patient Instructions (Signed)
Dolor a la palpacin de las mamas Breast Tenderness El dolor a la palpacin de las mamas es un problema frecuente en las mujeres de todas las edades, pero tambin puede ocurrir en los hombres. El dolor a la palpacin de las mamas puede oscilar desde molestias leves hasta un dolor intenso. En las mujeres, el dolor generalmente es intermitente y se relaciona con el ciclo menstrual, pero puede ser constante. Son muchas las causas posibles del dolor a la palpacin de las mamas, incluidos los cambios hormonales, infecciones y el uso de ciertos medicamentos. Puede someterse a pruebas como una mamografa o una ecografa, para detectar hallazgos inusuales. Por lo general, el dolor a la palpacin de las mamas no significa que usted tenga cncer de mama. Siga estas instrucciones en su casa: Control del dolor y de las molestias   Si se lo indican, aplique hielo sobre la zona dolorida. Para hacer esto: ? Ponga el hielo en una bolsa plstica. ? Coloque una toalla entre la piel y la bolsa. ? Coloque el hielo durante 20minutos, 2 a 3veces por da.  Use un sostn de soporte, especialmente al hacer actividad fsica. Quizs tambin desee usar ese sostn para dormir, si siente las mamas muy sensibles. Medicamentos  Use los medicamentos de venta libre y los recetados solamente como se lo haya indicado el mdico. Si el dolor es causado por alguna infeccin, posiblemente le receten un antibitico.  Si le recetaron un antibitico, tmelo o selo como se lo haya indicado el mdico. No deje de tomar los antibiticos aunque comience a sentirse mejor. Comida y bebida  El mdico puede recomendarle disminuir el consumo de grasa en su dieta. Puede hacer lo siguiente: ? Limite el consumo de alimentos fritos. ? A la hora de cocinarlos, opte por hornearlos, hervirlos, grillarlos y asarlos a la parrilla.  Disminuya la cantidad de cafena de su dieta. En cambio, beba ms agua y elija bebidas sin cafena. Instrucciones  generales   Lleve un registro de los das y las horas cuando tiene mayor sensibilidad en las mamas.  Pregntele al mdico cmo debe hacerse los exmenes de mamas en su casa. Esto la ayudar a notar si tiene algn crecimiento o bulto fuera de lo normal.  Concurra a todas las visitas de seguimiento como se lo haya indicado el mdico. Esto es importante. Comunquese con un mdico si:  Cualquier zona de la mama est dura, enrojecida y caliente al tacto. Puede ser un signo de infeccin.  Es mujer y: ? No est en etapa de lactancia y le sale lquido de los pezones, especialmente sangre o pus. ? Tiene un bulto nuevo o doloroso en la mama que no desaparece despus de la finalizacin del perodo menstrual.  Tiene fiebre.  El dolor no mejora o empeora.  El dolor le impide realizar las actividades cotidianas. Resumen  El dolor a la palpacin de las mamas puede oscilar desde molestias leves hasta un dolor intenso.  Son muchas las causas posibles del dolor a la palpacin de las mamas, incluidos los cambios hormonales, infecciones y el uso de ciertos medicamentos.  Puede tratarse con hielo, el uso un sostn de soporte y medicamentos.  Haga cambios en la dieta como se lo haya indicado el mdico. Esta informacin no tiene como fin reemplazar el consejo del mdico. Asegrese de hacerle al mdico cualquier pregunta que tenga. Document Revised: 05/05/2019 Document Reviewed: 05/05/2019 Elsevier Patient Education  2020 Elsevier Inc.  

## 2020-01-05 NOTE — Telephone Encounter (Signed)
Spoke with patient, advised of appt as seen below. Patient verbalizes understanding and is agreeable to date and time.   Routing to provider for final review. Patient is agreeable to disposition. Will close encounter.   

## 2020-01-05 NOTE — Telephone Encounter (Signed)
Spoke with [place] at [place], patient scheduled for bilateral Dx MMG and ultrasounds on 01/11/20 at 8:15am.   Patient placed in MMG hold.   Order faxed to [facility].   Call to patient, no answer, mailbox full.

## 2020-01-11 ENCOUNTER — Other Ambulatory Visit: Payer: Self-pay | Admitting: Obstetrics & Gynecology

## 2020-01-11 ENCOUNTER — Encounter: Payer: Self-pay | Admitting: Obstetrics & Gynecology

## 2020-01-11 DIAGNOSIS — R7989 Other specified abnormal findings of blood chemistry: Secondary | ICD-10-CM

## 2020-01-11 NOTE — Progress Notes (Signed)
pth lab order placed.

## 2020-01-12 ENCOUNTER — Encounter: Payer: BLUE CROSS/BLUE SHIELD | Admitting: Gastroenterology

## 2020-01-17 ENCOUNTER — Telehealth: Payer: Self-pay | Admitting: *Deleted

## 2020-01-17 NOTE — Telephone Encounter (Signed)
Patient no show PV today. During her appointment time the interpreter called her and left a message. Kelsey Bowen also called the patient, no answer. I called the patient at this time, no answer, "mail box is full" so I was unable to leave a message. PV and colonoscopy cancelled at this time and no show letter mailed to the patient.

## 2020-01-19 ENCOUNTER — Telehealth: Payer: Self-pay | Admitting: *Deleted

## 2020-01-19 NOTE — Telephone Encounter (Signed)
Call to patient, mailbox full, unable to leave message.   Call to review [facility] MMG report dated 01/11/20 per Leota Sauers, CNM. " Fibroglandular tissue noted, no concerns. Lymph nodes bilateral normal".   Patient removed from MMG hold.  Copy of report to Dr. Hyacinth Meeker to review and sign.

## 2020-01-25 ENCOUNTER — Encounter: Payer: Self-pay | Admitting: Gastroenterology

## 2020-01-28 ENCOUNTER — Other Ambulatory Visit: Payer: Self-pay

## 2020-02-02 ENCOUNTER — Encounter: Payer: Self-pay | Admitting: Certified Nurse Midwife

## 2020-02-02 NOTE — Telephone Encounter (Signed)
Call to patient, left detailed message, ok per dpr. Advised as seen below per Deborah Leonard, CNM. Return call to office if any additional questions.   Encounter closed.  

## 2020-02-16 ENCOUNTER — Telehealth: Payer: Self-pay | Admitting: *Deleted

## 2020-02-16 NOTE — Telephone Encounter (Signed)
Call placed to patient using pacific interpretor ID # [id number]. Left message to call Noreene Larsson, RN at [facility] [phone].

## 2020-02-16 NOTE — Telephone Encounter (Signed)
-----   Message from Jerene Bears, MD sent at 02/14/2020 11:36 AM EDT ----- Regarding: follow up from visit 12/13/2019 Pt will need translator.  She was seen 12/13/2019 for new patient exam.  I have received her outside records and there are no vaccinations documented in this.  Also, her lab work showed an elevated PTH level.  She should have this repeated.  Order has been placed.  Please call pt and let her know this please.  Thanks. Rosalita Chessman

## 2020-02-23 NOTE — Telephone Encounter (Signed)
Call to patient using pacific interpretor ID # [id number].  Left message to call Noreene Larsson, RN at [facility] [phone].   No alternative number on file.  MyChart not active.

## 2020-03-10 NOTE — Telephone Encounter (Signed)
No return call from patient.   Letter pended.   Letter to Dr. Hyacinth Meeker to review and sign.

## 2020-03-10 NOTE — Telephone Encounter (Signed)
Letter signed by Dr. Hyacinth Meeker and placed in standard [place] mail to address on file.   Encounter closed.

## 2020-03-22 ENCOUNTER — Telehealth: Payer: Self-pay | Admitting: *Deleted

## 2020-03-22 NOTE — Telephone Encounter (Signed)
Incoming call from patient. Spoke with patient. Patient received letter dated 03/10/20 regarding f/u lab.  Lab appt scheduled for 5/17 at 2:45pm for PTH.   Patient verbalizes understanding and is agreeable to date and time. Order previously placed.   Routing to provider for final review. Patient is agreeable to disposition. Will close encounter.

## 2020-03-27 ENCOUNTER — Other Ambulatory Visit: Payer: Self-pay

## 2020-03-28 ENCOUNTER — Other Ambulatory Visit: Payer: Self-pay | Admitting: Obstetrics & Gynecology

## 2020-03-28 NOTE — Telephone Encounter (Signed)
Medication refill request: Estradiol 2mg  #90, R2 Last [facility]:  12-13-19 Next [facility]: None Last MMG (if hormonal medication request):01-11-20 Rt.Br.US/Neg/BiRads1--screening 1 yr. Refill authorized: please refill if appropriate

## 2020-06-13 ENCOUNTER — Encounter: Payer: Self-pay | Admitting: Certified Nurse Midwife

## 2020-09-19 ENCOUNTER — Encounter: Payer: Self-pay | Admitting: Obstetrics & Gynecology

## 2020-09-20 LAB — COLOGUARD: COLOGUARD: POSITIVE — AB

## 2020-12-06 ENCOUNTER — Ambulatory Visit: Payer: BC Managed Care – PPO | Admitting: Gastroenterology

## 2020-12-26 ENCOUNTER — Encounter: Payer: Self-pay | Admitting: Gastroenterology

## 2020-12-26 ENCOUNTER — Ambulatory Visit: Payer: BC Managed Care – PPO | Admitting: Gastroenterology

## 2021-02-06 ENCOUNTER — Other Ambulatory Visit: Payer: Self-pay

## 2021-02-06 ENCOUNTER — Encounter (HOSPITAL_COMMUNITY): Payer: Self-pay

## 2021-02-06 ENCOUNTER — Emergency Department (HOSPITAL_COMMUNITY)
Admission: EM | Admit: 2021-02-06 | Discharge: 2021-02-07 | Disposition: A | Discharge status: Home / Self Care | Payer: BC Managed Care – PPO | Attending: Emergency Medicine | Admitting: Emergency Medicine

## 2021-02-06 ENCOUNTER — Emergency Department (HOSPITAL_COMMUNITY): Payer: BC Managed Care – PPO

## 2021-02-06 DIAGNOSIS — X58XXXA Exposure to other specified factors, initial encounter: Secondary | ICD-10-CM | POA: Insufficient documentation

## 2021-02-06 DIAGNOSIS — S79911A Unspecified injury of right hip, initial encounter: Secondary | ICD-10-CM | POA: Diagnosis present

## 2021-02-06 DIAGNOSIS — S32311A Displaced avulsion fracture of right ilium, initial encounter for closed fracture: Secondary | ICD-10-CM | POA: Insufficient documentation

## 2021-02-06 DIAGNOSIS — Y990 Civilian activity done for income or pay: Secondary | ICD-10-CM | POA: Diagnosis not present

## 2021-02-06 DIAGNOSIS — R52 Pain, unspecified: Secondary | ICD-10-CM

## 2021-02-06 DIAGNOSIS — S32313A Displaced avulsion fracture of unspecified ilium, initial encounter for closed fracture: Secondary | ICD-10-CM

## 2021-02-06 NOTE — ED Triage Notes (Signed)
Pt states right hip started hurting two days ago and pain has been worsening.  Pt seen at [facility] for same and diagnosed with muscle inflammation. Pt ambulatory to triage.

## 2021-02-07 ENCOUNTER — Encounter (HOSPITAL_COMMUNITY): Payer: Self-pay | Admitting: Student

## 2021-02-07 MED ORDER — POLYETHYLENE GLYCOL 3350 17 G PO PACK: 17.0000 g | PACK | Freq: Every day | ORAL | 0 refills | Status: AC | PRN

## 2021-02-07 MED ORDER — NAPROXEN 250 MG PO TABS
500.0000 mg | ORAL_TABLET | Freq: Once | ORAL | Status: AC
  Administered 2021-02-07: 500 mg via ORAL
  Filled 2021-02-07: qty 2

## 2021-02-07 MED ORDER — NAPROXEN 500 MG PO TABS: 500.0000 mg | ORAL_TABLET | Freq: Two times a day (BID) | ORAL | 0 refills | Status: AC | PRN

## 2021-02-07 NOTE — ED Notes (Signed)
Patient verbalizes understanding of discharge instructions. Prescriptions and instructions on how to use crutches were provided. Opportunity for questioning and answers were provided. Armband removed by staff, pt discharged from ED via wheelchair.

## 2021-02-07 NOTE — Discharge Instructions (Addendum)
You were seen in the emergency department today for right hip pain.  Your x-ray showed findings concerning for an avulsion fracture of your anterior inferior iliac spine which is an area of the pelvis where you are having pain.  Please see attached handout for further information regarding this injury.  We would like you to try to rest, utilize crutches when up and walking around. Please try to rest in the position we discussed.   We are sending you home with naproxen to take for pain.  - Naproxen is a nonsteroidal anti-inflammatory medication that will help with pain and swelling. Be sure to take this medication as prescribed with food, 1 pill every 12 hours,  It should be taken with food, as it can cause stomach upset, and more seriously, stomach bleeding. Do not take other nonsteroidal anti-inflammatory medications with this such as Advil, Motrin, Aleve, Mobic, Goodie Powder, or Motrin.    We are also sending you home with MiraLAX to take daily as needed for constipation.  You make take Tylenol per over the counter dosing with these medications.   We have prescribed you new medication(s) today. Discuss the medications prescribed today with your pharmacist as they can have adverse effects and interactions with your other medicines including over the counter and prescribed medications. Seek medical evaluation if you start to experience new or abnormal symptoms after taking one of these medicines, seek care immediately if you start to experience difficulty breathing, feeling of your throat closing, facial swelling, or rash as these could be indications of a more serious allergic reaction    Please call the [facility] that called you and left a voicemail for same tomorrow to schedule a close follow-up appointment for as soon as possible within the next 1 week..  Return to the ER for new or worsening symptoms including but not limited to increased pain, inability to walk, numbness,  weakness, fever, chills, abdominal pain, inability to have a bowel movement, vomiting, or any other concerns.  Google translate English to [employer] lo vieron en el departamento de emergencias por dolor en la cadera derecha. Su radiografa mostr hallazgos preocupantes para una fractura por avulsin de su espina ilaca anteroinferior, que es un rea de la pelvis donde tiene Engineer, mining. Consulte el folleto adjunto para obtener ms informacin sobre esta lesin. Nos gustara que trate de descansar, utilice muletas cuando se levante y camine. Intente descansar en la posicin que discutimos.  Te enviaremos a casa con naproxeno para Chief Technology Officer.  - El naproxeno es un medicamento antiinflamatorio no esteroideo que ayudar con Chief Technology Officer y la hinchazn. Asegrese de tomar [employer] segn lo prescrito con alimentos, 1 pastilla cada 12 horas. Debe tomarse con alimentos, ya que puede causar [employer] y, lo que es ms grave, sangrado estomacal. No tome otros medicamentos antiinflamatorios no esteroideos con esto, como Advil, Motrin, Aleve, Mobic, Goodie Powder o Motrin.  Tambin lo enviaremos a casa con MiraLAX para que lo tome diariamente segn sea necesario para el estreimiento.  Debe tomar Tylenol segn la dosificacin de venta libre con estos medicamentos.  Le hemos recetado nuevos medicamentos hoy. Hable sobre los medicamentos recetados hoy con su farmacutico, ya que pueden tener efectos adversos e interacciones con sus otros medicamentos, incluidos los medicamentos recetados y de [place]. Busque una evaluacin mdica si comienza a experimentar sntomas nuevos o anormales despus de tomar uno de estos medicamentos, busque atencin mdica de inmediato si comienza a experimentar dificultad para respirar, sensacin  de que se le cierra la garganta, hinchazn facial o sarpullido, ya que estos podran ser indicios de una enfermedad ms grave. reaccin alrgica    Llame a la clnica ortopdica  de [facility] que lo llam y dej un mensaje de voz para el mismo maana para programar una cita de seguimiento lo antes posible dentro de la prxima semana. Regrese a la sala de emergencias por sntomas nuevos o que empeoran, incluidos, entre otros, aumento del [place], incapacidad para caminar, entumecimiento, debilidad, fiebre, escalofros, dolor abdominal, incapacidad para defecar, vmitos o cualquier otra preocupacin.

## 2021-02-07 NOTE — ED Provider Notes (Signed)
[facility] EMERGENCY DEPARTMENT Provider Note   CSN: [id number] Arrival date & time: 02/06/21  2207     History Chief Complaint  Patient presents with  . Leg Pain    Kelsey Bowen is a 50 y.o. female with a hx of prior tubal ligation & depression who presents to the ED with complaints of right hip pain for the past 3 days.  Patient states that she had onset of right anterior hip pain 03/27 while she was at work.  She states that she did not have any direct trauma or injury but that she does go up and down 3 flights of stairs every 15 minutes while at work, this is not a new activity for her.  The pain has been constant since onset, it is worse with ambulation, hip flexion, as well as abduction.  Deviated some with ibuprofen.  She was seen at a walk-in clinic yesterday she was given a prescription for Robaxin which she took without much relief.  She received a phone call from an orthopedic clinic today and they left a voicemail, she would like further clarification of this.  She denies numbness, tingling, weakness, open wounds, chest pain, dyspnea, nausea, or vomiting.  Translator utilized throughout Audiological scientist.  HPI     Past Medical History:  Diagnosis Date  . Anxiety   . Chloasma   . Ovarian cyst   . Premature ovarian failure     Patient Active Problem List   Diagnosis Date Noted  . Depressive disorder 02/06/2014  . Suicidal ideation 02/06/2014  . 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 Libido, decreased 07/01/2013  . Hot flashes 07/01/2013  . Amenorrhea 07/01/2013  . 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 Ovarian cyst, left 08/03/2012  . 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 Chloasma 03/10/2012    Past Surgical History:  Procedure Laterality Date  . CESAREAN SECTION     x3  . TUBAL LIGATION       OB History    Gravida  4   Para  4   Term  3   Preterm  1   AB      Living  4     SAB      IAB      Ectopic      Multiple      Live Births   4           Family History  Problem Relation Age of Onset  . Diabetes Maternal Aunt     Social History   Tobacco Use  . Smoking status: Never Smoker  . Smokeless tobacco: Never Used  Vaping Use  . Vaping Use: Never used  Substance Use Topics  . Alcohol use: No  . Drug use: No    Home Medications Prior to Admission medications   Medication Sig Start Date End Date Taking? Authorizing Provider  estradiol (ESTRACE) 2 MG tablet TAKE 1 TABLET(2 MG) BY MOUTH DAILY 03/28/20   Jerene Bears, MD  medroxyPROGESTERone (PROVERA) 10 MG tablet Take 1 tab po days 1-15 each month. 12/13/19   Jerene Bears, MD  UNABLE TO FIND Pill for appetite    [provider]    Allergies    Patient has no known allergies.  Review of Systems   Review of Systems  Constitutional: Negative for chills and fever.  Respiratory: Negative for shortness of breath.   Cardiovascular: Negative for chest pain.  Gastrointestinal: Positive for constipation. Negative for abdominal pain, nausea and vomiting.  Genitourinary: Negative for dysuria.  Musculoskeletal: Positive for arthralgias.  Skin: Negative for wound.  Neurological: Negative for syncope, weakness and numbness.  All other systems reviewed and are negative.   Physical Exam Updated Vital Signs BP 131/64 (BP Location: Right Arm)   Pulse 79   Temp 97.7 F (36.5 C) (Oral)   Resp 16   LMP 12/27/2019 (Exact Date)   SpO2 100%   Physical Exam Vitals and nursing note reviewed.  Constitutional:      General: She is not in acute distress.    Appearance: She is well-developed. She is not ill-appearing or toxic-appearing.  HENT:     Head: Normocephalic and atraumatic.  Eyes:     General:        Right eye: No discharge.        Left eye: No discharge.     Conjunctiva/sclera: Conjunctivae normal.  Cardiovascular:     Rate and Rhythm: Normal rate and regular rhythm.     Pulses:          Dorsalis pedis pulses are 2+ on the right side and  2+ on the left side.       Posterior tibial pulses are 2+ on the right side and 2+ on the left side.  Pulmonary:     Effort: Pulmonary effort is normal. No respiratory distress.     Breath sounds: Normal breath sounds. No wheezing, rhonchi or rales.  Abdominal:     General: There is no distension.     Palpations: Abdomen is soft.     Tenderness: There is no abdominal tenderness. There is no guarding or rebound.  Musculoskeletal:     Cervical back: Neck supple.     Comments: Lower extremities: No obvious deformity, appreciable swelling, edema, erythema, ecchymosis, warmth, or open wounds. Patient has intact AROM to the left hip, bilateral knees & bilateral ankles. Able to move all digits.  She has significant difficulty with active right hip flexion and abduction, is able to do so fairly minimally.  I am able to passively flex and Abduct the hip.  Patient has point tenderness over the anterior inferior iliac spine of the right hip.  Otherwise no bony tenderness.  Compartments are soft.  No calf tenderness.  Skin:    General: Skin is warm and dry.     Capillary Refill: Capillary refill takes less than 2 seconds.     Findings: No rash.  Neurological:     Mental Status: She is alert.     Comments: Alert. Clear speech. Sensation grossly intact to bilateral lower extremities. 5/5 strength with plantar/dorsiflexion bilaterally. Patient ambulatory with antalgic gait.   Psychiatric:        Mood and Affect: Mood normal.        Behavior: Behavior normal.     ED Results / Procedures / Treatments   Labs (all labs ordered are listed, but only abnormal results are displayed) Labs Reviewed - No data to display  EKG None  Radiology DG HIP UNILAT WITH PELVIS 2-3 VIEWS RIGHT  Result Date: 02/06/2021 CLINICAL DATA:  Hip pain which began 2 days ago, worsening, ambulatory lymph EXAM: DG HIP (WITH OR WITHOUT PELVIS) 2-3V RIGHT COMPARISON:  None. FINDINGS: The bones of the pelvis appear intact and  congruent. Proximal femora appear intact and aligned. There is a fairly homogeneous ovoid mineralization within the soft tissues adjacent the anterior inferior iliac spine without adjacent cortical defect, most suggestive of soft tissue mineralization/hydroxyapatite deposition. No other acute or conspicuous osseous  abnormalities. Tiny benign os acetabuli noted along the left superior acetabular margin. Minimal degenerative changes in the spine. Moderate to large colonic stool burden, correlate for features of constipation. IMPRESSION: 1. Likely soft tissue mineralization adjacent to the right anterior inferior iliac spine, most suggestive of hydroxyapatite deposition. Avulsion injury significantly less favored. 2. No other acute or suspicious osseous abnormalities. 3. Moderate to large colonic stool burden, correlate for features of constipation. Electronically Signed   By: Kreg ShropshirePrice  DeHay M.D.   On: 02/06/2021 22:51    Procedures Procedures   Medications Ordered in ED Medications  naproxen (NAPROSYN) tablet 500 mg (has no administration in time range)    ED Course  I have reviewed the triage vital signs and the nursing notes.  Pertinent labs & imaging results that were available during my care of the patient were reviewed by me and considered in my medical decision making (see chart for details).    MDM Rules/Calculators/A&P                          Patient presents to the ED with complaints of right hip pain. Nontoxic, vitals without significant abnormality..   Additional history obtained:  Additional history obtained from chart review & nursing note review.   Imaging Studies ordered:  An x-ray of the right hip was ordered by triage, I independently reviewed, formal radiology impression shows:  1. Likely soft tissue mineralization adjacent to the right anterior inferior iliac spine, most suggestive of hydroxyapatite deposition. Avulsion injury significantly less favored. 2. No other acute  or suspicious osseous abnormalities. 3. Moderate to large colonic stool burden, correlate for features of constipation.  ED Course:  Given patient with point tenderness over the anterior inferior iliac spine after going up and down the steps frequently favor avulsion injury when clinically correlating patient's presentation.  For this we discussed rest and application of ICE. Will start higher dose NSAID with providing of crutches in the emergency department.  We discussed when resting to keep the hip in flexed position.  Patient speaks both [place] and [place], however Spanish is her primary language, she played the voicemail she received earlier today during our conversation, this was a Engineer, technical salesvoic[facility] up follow-up for her, I encouraged her to call them tomorrow morning to set this up, the phone number will also be provided in her discharge instructions today. Interpretor was utilized throughout our encounter. In terms of colonic stool burden noted on x-ray this is consistent with patient provided history, upon further discussion with her she does note that she does have problems with chronic constipation, last bowel movement was 2 days prior, she is passing gas, she is not having any vomiting or significant abdominal pain at this time.  Abdomen is nontender without peritoneal signs, I do not suspect acute surgical process.  Will provide diet recommendations as well as MiraLAX as needed.  I discussed results, treatment plan, need for follow-up, and return precautions with the patient. Provided opportunity for questions, patient confirmed understanding and is in agreement with plan.   Findings and plan of care discussed with supervising physician Dr. Preston FleetingGlick who is in agreement.   Portions of this note were generated with Scientist, clinical (histocompatibility and immunogenetics)Dragon dictation software. Dictation errors may occur despite best attempts at proofreading.  Final Clinical Impression(s) / ED Diagnoses Final  diagnoses:  Closed avulsion fracture of anterior inferior iliac spine of pelvis (HCC)    Rx / DC Orders ED  Discharge Orders         Ordered    naproxen (NAPROSYN) 500 MG tablet  2 times daily PRN        02/07/21 0100    polyethylene glycol (MIRALAX) 17 g packet  Daily PRN        02/07/21 0100           Erskin Zinda, Pleas Koch, PA-C 02/07/21 0108    Dione Booze, MD 02/07/21 [phone]

## 2021-06-04 ENCOUNTER — Other Ambulatory Visit: Payer: Self-pay | Admitting: Obstetrics & Gynecology

## 2021-06-28 ENCOUNTER — Encounter: Payer: Self-pay | Admitting: Gastroenterology

## 2021-08-08 ENCOUNTER — Encounter: Payer: BC Managed Care – PPO | Admitting: Gastroenterology

## 2021-09-05 ENCOUNTER — Other Ambulatory Visit: Payer: Self-pay

## 2021-09-05 ENCOUNTER — Ambulatory Visit (AMBULATORY_SURGERY_CENTER): Payer: Self-pay | Admitting: *Deleted

## 2021-09-05 ENCOUNTER — Encounter: Payer: Self-pay | Admitting: Gastroenterology

## 2021-09-05 DIAGNOSIS — Z1211 Encounter for screening for malignant neoplasm of colon: Secondary | ICD-10-CM

## 2021-09-05 NOTE — Progress Notes (Signed)
Pre visit completed in person and assisted by interpretor Delcie Roch.   No egg or soy allergy known to patient  No issues known to pt with past sedation with any surgeries or procedures Patient denies ever being told they had issues or difficulty with intubation  No FH of Malignant Hyperthermia Pt is not on diet pills Pt is not on  home 02  Pt is not on blood thinners  Pt denies issues with constipation  No A fib or A flutter  Pt is fully vaccinated  for Covid    Due to the COVID-19 pandemic we are asking patients to follow certain guidelines in [facility] and the [facility]   Pt aware of COVID protocols and [facility] guidelines

## 2021-09-19 ENCOUNTER — Telehealth: Payer: Self-pay | Admitting: Gastroenterology

## 2021-09-19 ENCOUNTER — Encounter: Payer: BC Managed Care – PPO | Admitting: Gastroenterology

## 2022-01-09 NOTE — Progress Notes (Deleted)
? ?  Kelsey Bowen 1971-03-05 [id number] ? ? ?History: Postmenopausal 51 y.o. presents for annual exam. ? ? ?Gynecologic History ?Postmenopausal ?Last Pap: 2018. Results were: {norm/abn:16337} ?Last mammogram: 2021. Results were: {norm/abn:16337} ?Last colonoscopy: positive cologuard 2021 ?HRT use: *** ? ?Obstetric History ?OB History  ?Gravida Para Term Preterm AB Living  ?4 4 3 1   4   ?SAB IAB Ectopic Multiple Live Births  ?        4  ?  ?# Outcome Date GA Lbr Len/2nd Weight Sex Delivery Anes PTL Lv  ?4 Term     F CS-Unspec  N LIV  ?3 Term     M CS-Unspec  N LIV  ?2 Term     F Vag-Spont  N LIV  ?1 Preterm     F CS-Unspec  Y LIV  ? ? ? ?{Common ambulatory SmartLinks:19316} ? ?Review of Systems ?{ros; complete:30496}  ?Past medical history, past surgical history, family history and social history were all reviewed and documented in the EPIC chart. ? ?Exam: ? ?There were no vitals filed for this visit. ?There is no height or weight on file to calculate BMI. ? ?General appearance:  Normal ?Thyroid:  Symmetrical, normal in size, without palpable masses or nodularity. ?Respiratory ? Auscultation:  Clear without wheezing or rhonchi ?Cardiovascular ? Auscultation:  Regular rate, without rubs, murmurs or gallops ? Edema/varicosities:  Not grossly evident ?Abdominal ? Soft,nontender, without masses, guarding or rebound. ? Liver/spleen:  No organomegaly noted ? Hernia:  None appreciated ? Skin ? Inspection:  Grossly normal ?Breasts: Examined lying and sitting.  ? Right: Without masses, retractions, nipple discharge or axillary adenopathy. ? ? Left: Without masses, retractions, nipple discharge or axillary adenopathy. ?Genitourinary  ? Inguinal/mons:  Normal without inguinal adenopathy ? External genitalia:  Normal appearing vulva with no masses, tenderness, or lesions ? BUS/Urethra/Skene's glands:  Normal ? Vagina:  Normal appearing with normal color and discharge, no lesions. Atrophy: ***  ? Cervix:  Normal appearing without  discharge or lesions ? Uterus:  Normal in size, shape and contour.  Midline and mobile, nontender ? Adnexa/parametria:   ?  Rt: Normal in size, without masses or tenderness. ?  Lt: Normal in size, without masses or tenderness. ? Anus and perineum: Normal ? Digital rectal exam: Normal sphincter tone without palpated masses or tenderness ? ?Patient informed chaperone available to be present for breast and pelvic exam. Patient has requested no chaperone to be present. Patient has been advised what will be completed during breast and pelvic exam.  ? ?Assessment/Plan:   ?- Well woman exam ?-Mammo ?- DEXA ?-Colonoscopy ? ?Discussed SBE, colonoscopy and DEXA screening as directed. Recommend of exercise weekly, including weight bearing exercise. Encouraged the use of seatbelts and sunscreen.  ?Return in 1 year for annual or sooner prn. ? ? WHNP-BC, 2:47 PM 01/09/2022  ?

## 2022-01-11 ENCOUNTER — Ambulatory Visit: Payer: BC Managed Care – PPO | Admitting: Radiology

## 2022-01-12 IMAGING — CR DG HIP (WITH OR WITHOUT PELVIS) 2-3V*R*
3 series · 3 of 3 positions shown · non-contrast
Comparison: None.

CLINICAL DATA: Hip pain which began 2 days ago, worsening,
ambulatory lymph

EXAM:
DG HIP (WITH OR WITHOUT PELVIS) 2-3V RIGHT

[pelvis ap]
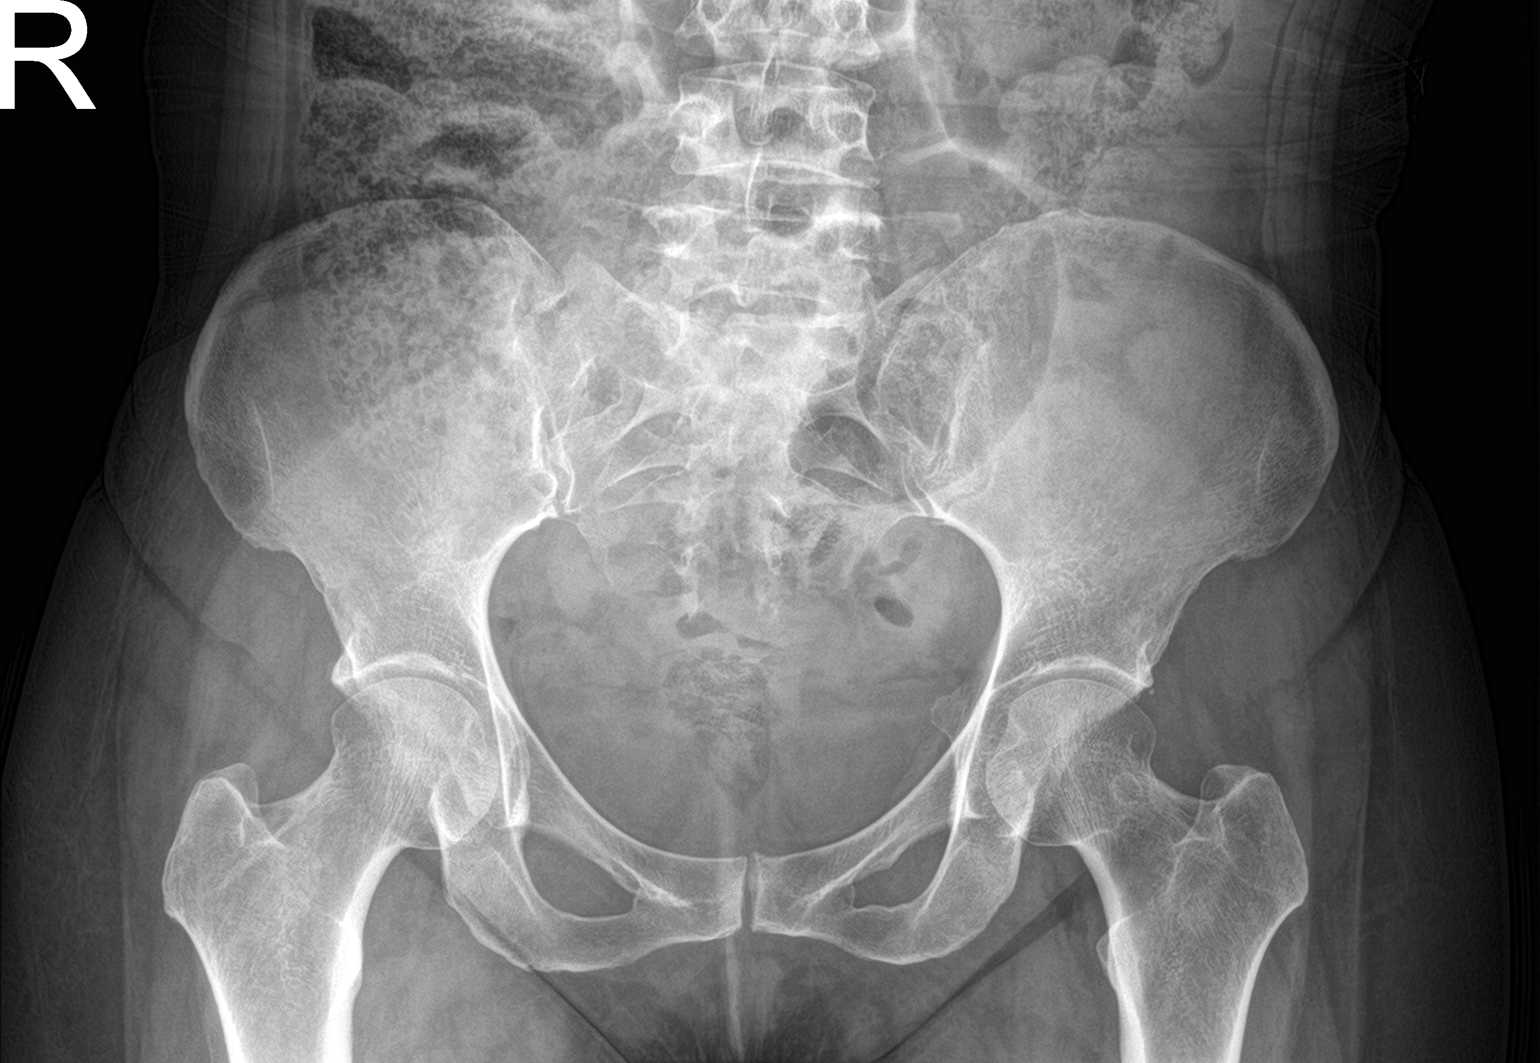

[hip ap]
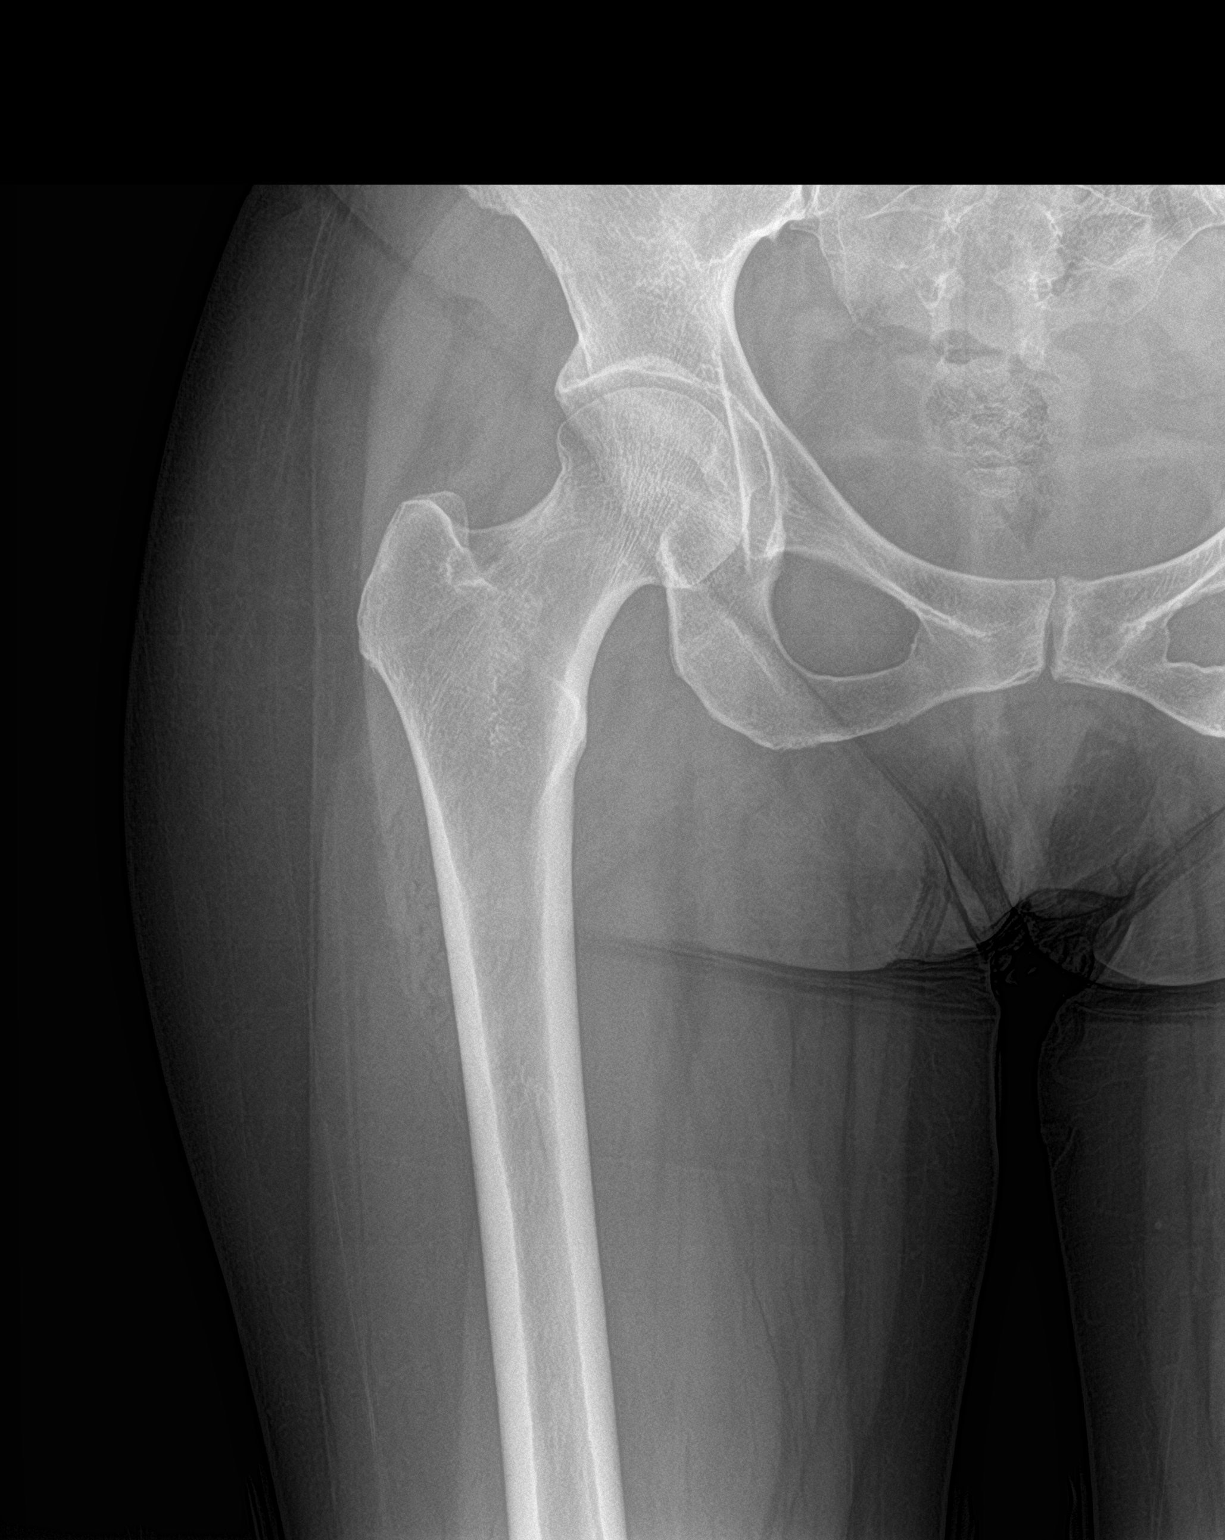

[hip lat]
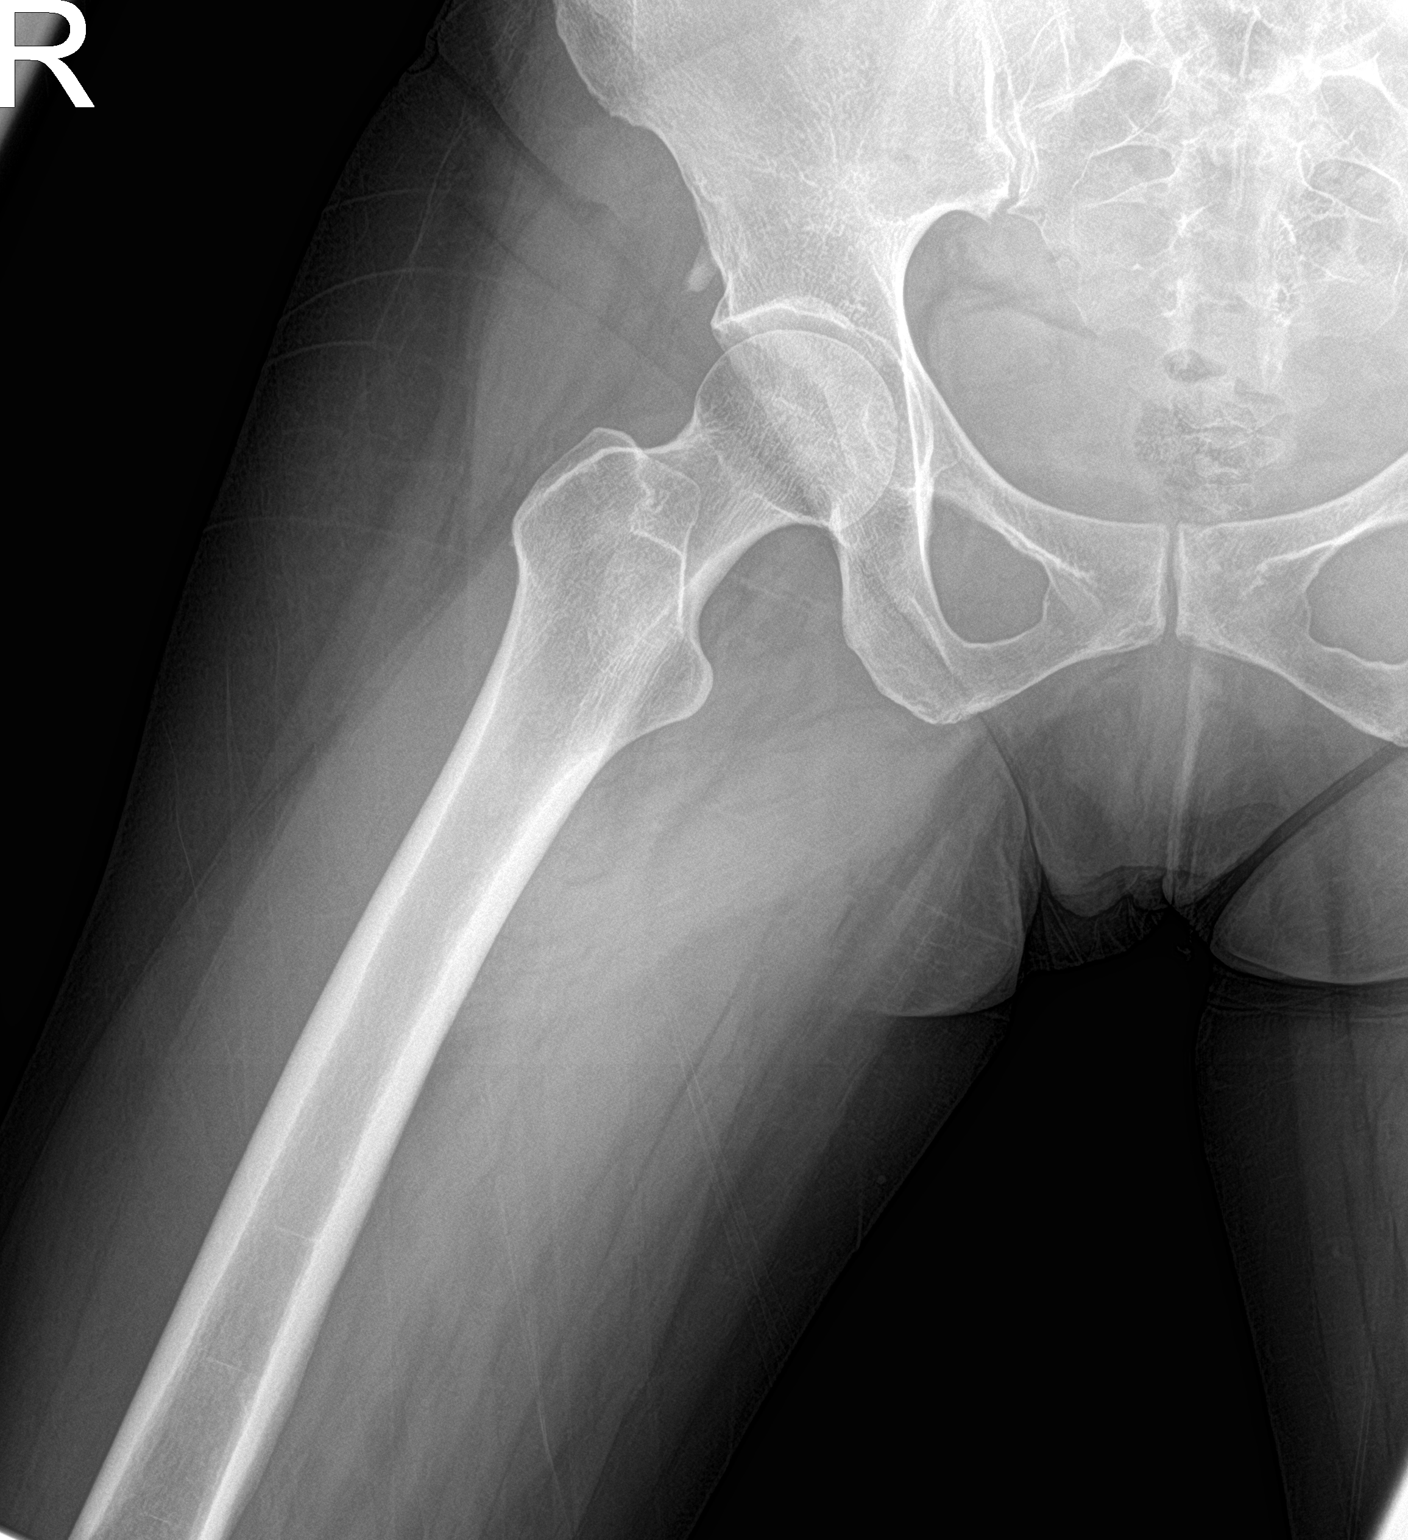

[3 of 3 positions shown; findings below may reference images not displayed]

FINDINGS: The bones of the pelvis appear intact and congruent. Proximal femora
appear intact and aligned. There is a fairly homogeneous ovoid
mineralization within the soft tissues adjacent the anterior
inferior iliac spine without adjacent cortical defect, most
suggestive of soft tissue mineralization/hydroxyapatite deposition.
No other acute or conspicuous osseous abnormalities. Tiny benign os
acetabuli noted along the left superior acetabular margin. Minimal
degenerative changes in the spine. Moderate to large colonic stool
burden, correlate for features of constipation.
IMPRESSION: 1. Likely soft tissue mineralization adjacent to the right anterior
inferior iliac spine, most suggestive of hydroxyapatite deposition.
Avulsion injury significantly less favored.
2. No other acute or suspicious osseous abnormalities.
3. Moderate to large colonic stool burden, correlate for features of
constipation.

## 2022-01-14 ENCOUNTER — Ambulatory Visit (INDEPENDENT_AMBULATORY_CARE_PROVIDER_SITE_OTHER): Payer: BC Managed Care – PPO | Admitting: Nurse Practitioner

## 2022-01-14 ENCOUNTER — Encounter: Payer: Self-pay | Admitting: Nurse Practitioner

## 2022-01-14 ENCOUNTER — Other Ambulatory Visit: Payer: Self-pay

## 2022-01-14 VITALS — BP 118/76 | Ht 59.0 in | Wt 113.0 lb

## 2022-01-14 DIAGNOSIS — R195 Other fecal abnormalities: Secondary | ICD-10-CM

## 2022-01-14 DIAGNOSIS — Z01419 Encounter for gynecological examination (general) (routine) without abnormal findings: Secondary | ICD-10-CM | POA: Diagnosis not present

## 2022-01-14 DIAGNOSIS — Z79890 Hormone replacement therapy (postmenopausal): Secondary | ICD-10-CM

## 2022-01-14 MED ORDER — PROGESTERONE MICRONIZED 100 MG PO CAPS: 100.0000 mg | ORAL_CAPSULE | Freq: Every evening | ORAL | 3 refills | Status: AC

## 2022-01-14 MED ORDER — ESTRADIOL 0.075 MG/24HR TD PTWK: 0.0750 mg | MEDICATED_PATCH | TRANSDERMAL | 3 refills | Status: DC

## 2022-01-14 NOTE — Progress Notes (Signed)
? ?Kelsey Bowen Apr 25, 1971 [id number] ? ? ?History:  51 y.o. [id number] presents for annual exam. Postmenopausal. Complains of significant hot flashes and night sweats. She has been off and on HRT the last couple of years due to running out of medications and because she thought symptoms were supposed to stop 1 year after last period. She was provided 1 week supply by PCP to get her to this appointment. Normal pap history. Interpreter present during visit.  ? ?Gynecologic History ?Patient's last menstrual period was 12/27/2019 (exact date). ?  ?Contraception/Family planning: post menopausal status and tubal ligation ?Sexually active: No ? ?Health Maintenance ?Last Pap: 12/13/2019. Results were: Normal, 5-year repeat ?Last mammogram: 2022. Results were: Normal ?Last colonoscopy:  Never. Positive Cologuard 09/2020 ?Last Dexa: Not indicated ? ?Past medical history, past surgical history, family history and social history were all reviewed and documented in the EPIC chart. 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  ? ?ROS:  A ROS was performed and pertinent positives and negatives are included. ? ?Exam: ? ?Vitals:  ? 01/14/22 1209  ?BP: 118/76  ?Weight: 113 lb (51.3 kg)  ?Height: 4\' 11"  (1.499 m)  ? ?Body mass index is 22.82 kg/m?. ? ?General appearance:  Normal ?Thyroid:  Symmetrical, normal in size, without palpable masses or nodularity. ?Respiratory ? Auscultation:  Clear without wheezing or rhonchi ?Cardiovascular ? Auscultation:  Regular rate, without rubs, murmurs or gallops ? Edema/varicosities:  Not grossly evident ?Abdominal ? Soft,nontender, without masses, guarding or rebound. ? Liver/spleen:  No organomegaly noted ? Hernia:  None appreciated ? Skin ? Inspection:  Grossly normal ?Breasts: Examined lying and sitting.  ? Right: Without masses, retractions, nipple discharge or axillary adenopathy. ? ? Left: Without masses, retractions, nipple discharge or axillary adenopathy. ?Genitourinary   ? Inguinal/mons:  Normal without inguinal adenopathy ? External genitalia:  Normal appearing vulva with no masses, tenderness, or lesions ? BUS/Urethra/Skene's glands:  Normal ? Vagina:  Normal appearing with normal color and discharge, no lesions ? Cervix:  Normal appearing without discharge or lesions ? Uterus:  Normal in size, shape and contour.  Midline and mobile, nontender ? Adnexa/parametria:   ?  Rt: Normal in size, without masses or tenderness. ?  Lt: Normal in size, without masses or tenderness. ? Anus and perineum: Normal ? Digital rectal exam: Normal sphincter tone without palpated masses or tenderness ? ?Patient informed chaperone available to be present for breast and pelvic exam. Patient has requested no chaperone to be present. Patient has been advised what will be completed during breast and pelvic exam.  ? ?Assessment/Plan:  51 y.o. 38 for annual exam.  ? ?Well female exam with routine gynecological exam - Education provided on SBEs, importance of preventative screenings, current guidelines, high calcium diet, regular exercise, and multivitamin daily.  Labs with PCP.  ? ?Postmenopausal hormone therapy - Plan: progesterone (PROMETRIUM) 100 MG capsule nightly, estradiol (CLIMARA) 0.075 mg/24hr patch weekly. We discussed risks for blood clots, heart attack, stroke, and breast cancer, as well as the benefits of symptom management, heart health, and bone health. She wants to continue. 1 year supply provided.  ? ?Positive colorectal cancer screening using Cologuard test - 09/2020. Saw GI after this but could not afford the colonoscopy at that time. She is now considering doing it. She will call GI to set up consultation. No family history of colon cancer.  ? ?Screening for cervical cancer - Normal Pap history.  Will repeat at 5-year interval per guidelines. ? ?  Screening for breast cancer - Normal mammogram history.  Continue annual screenings.  Normal breast exam today. ? ?Return in 1 year for  annual.  ? ? ? ?Olivia Mackie DNP, 12:45 PM 01/14/2022 ? ?

## 2022-01-17 ENCOUNTER — Other Ambulatory Visit: Payer: Self-pay | Admitting: Nurse Practitioner

## 2022-01-17 ENCOUNTER — Telehealth: Payer: Self-pay

## 2022-01-17 DIAGNOSIS — Z79890 Hormone replacement therapy (postmenopausal): Secondary | ICD-10-CM

## 2022-01-17 MED ORDER — ESTRADIOL 1 MG PO TABS: 1.0000 mg | ORAL_TABLET | Freq: Every day | ORAL | 4 refills | Status: AC

## 2022-01-17 NOTE — Telephone Encounter (Signed)
I have sent Estradiol 1 mg PO instead. We discussed option to go back to tablets if preferred.  ?

## 2022-01-17 NOTE — Telephone Encounter (Signed)
Patient calling to report that her climara patches were too expensive and she said you guys had talked about this and she is requesting an alternative. Please advise.  ?

## 2023-01-20 ENCOUNTER — Other Ambulatory Visit: Payer: Self-pay | Admitting: Nurse Practitioner

## 2023-01-20 DIAGNOSIS — Z79890 Hormone replacement therapy (postmenopausal): Secondary | ICD-10-CM

## 2023-01-20 NOTE — Telephone Encounter (Signed)
Message sent to appt desk to schedule AEX prior to routing to [facility].

## 2024-07-15 ENCOUNTER — Encounter: Payer: Self-pay | Admitting: Nurse Practitioner
